# Patient Record
Sex: Female | Born: 1985 | Race: White | Hispanic: No | Marital: Married | Smoking: Current every day smoker
Health system: Southern US, Community
[De-identification: ages and names within clinical notes are randomized; demographics above are authoritative.]

## PROBLEM LIST (undated history)

## (undated) DIAGNOSIS — F419 Anxiety disorder, unspecified: Secondary | ICD-10-CM

## (undated) DIAGNOSIS — F988 Other specified behavioral and emotional disorders with onset usually occurring in childhood and adolescence: Secondary | ICD-10-CM

## (undated) HISTORY — PX: CHOLECYSTECTOMY: SHX55

## (undated) HISTORY — PX: TUBAL LIGATION: SHX77

## (undated) HISTORY — PX: APPENDECTOMY: SHX54

## (undated) HISTORY — DX: Other specified behavioral and emotional disorders with onset usually occurring in childhood and adolescence: F98.8

## (undated) HISTORY — DX: Anxiety disorder, unspecified: F41.9

---

## 2004-01-09 ENCOUNTER — Inpatient Hospital Stay: Payer: Self-pay | Admitting: Obstetrics and Gynecology

## 2004-02-02 ENCOUNTER — Emergency Department: Payer: Self-pay | Admitting: Emergency Medicine

## 2004-02-02 ENCOUNTER — Other Ambulatory Visit: Payer: Self-pay

## 2004-02-12 ENCOUNTER — Ambulatory Visit: Payer: Self-pay | Admitting: General Surgery

## 2005-01-13 ENCOUNTER — Emergency Department: Payer: Self-pay | Admitting: Emergency Medicine

## 2005-09-14 ENCOUNTER — Inpatient Hospital Stay: Payer: Self-pay | Admitting: Vascular Surgery

## 2006-02-10 ENCOUNTER — Emergency Department: Payer: Self-pay

## 2006-04-27 ENCOUNTER — Emergency Department: Payer: Self-pay | Admitting: Emergency Medicine

## 2007-02-24 ENCOUNTER — Observation Stay: Payer: Self-pay | Admitting: Obstetrics and Gynecology

## 2007-03-31 ENCOUNTER — Observation Stay: Payer: Self-pay | Admitting: Obstetrics & Gynecology

## 2007-04-26 ENCOUNTER — Observation Stay: Payer: Self-pay | Admitting: Obstetrics & Gynecology

## 2007-05-20 ENCOUNTER — Observation Stay: Payer: Self-pay

## 2007-05-21 ENCOUNTER — Ambulatory Visit: Payer: Self-pay | Admitting: Unknown Physician Specialty

## 2007-05-23 ENCOUNTER — Observation Stay: Payer: Self-pay

## 2007-05-30 ENCOUNTER — Observation Stay: Payer: Self-pay

## 2007-06-05 ENCOUNTER — Observation Stay: Payer: Self-pay

## 2007-06-19 ENCOUNTER — Observation Stay: Payer: Self-pay

## 2007-06-27 ENCOUNTER — Inpatient Hospital Stay: Payer: Self-pay

## 2007-10-17 ENCOUNTER — Emergency Department: Payer: Self-pay | Admitting: Emergency Medicine

## 2008-08-04 ENCOUNTER — Emergency Department: Payer: Self-pay | Admitting: Emergency Medicine

## 2009-05-23 ENCOUNTER — Emergency Department: Payer: Self-pay | Admitting: Unknown Physician Specialty

## 2009-09-05 ENCOUNTER — Observation Stay: Payer: Self-pay

## 2009-11-16 ENCOUNTER — Observation Stay: Payer: Self-pay

## 2010-01-10 ENCOUNTER — Observation Stay: Payer: Self-pay | Admitting: Obstetrics and Gynecology

## 2010-01-11 ENCOUNTER — Ambulatory Visit: Payer: Self-pay | Admitting: Obstetrics and Gynecology

## 2010-02-07 ENCOUNTER — Emergency Department: Payer: Self-pay | Admitting: Emergency Medicine

## 2010-05-15 ENCOUNTER — Emergency Department: Payer: Self-pay | Admitting: Emergency Medicine

## 2011-11-19 ENCOUNTER — Emergency Department: Payer: Self-pay | Admitting: Emergency Medicine

## 2011-11-20 ENCOUNTER — Emergency Department: Payer: Self-pay | Admitting: Emergency Medicine

## 2011-11-25 ENCOUNTER — Emergency Department: Payer: Self-pay | Admitting: Internal Medicine

## 2012-07-21 ENCOUNTER — Emergency Department: Payer: Self-pay | Admitting: Emergency Medicine

## 2012-07-21 LAB — CBC
HCT: 34.9 % — ABNORMAL LOW (ref 35.0–47.0)
HGB: 11.6 g/dL — ABNORMAL LOW (ref 12.0–16.0)
MCH: 25.7 pg — ABNORMAL LOW (ref 26.0–34.0)
MCHC: 33.3 g/dL (ref 32.0–36.0)
MCV: 77 fL — ABNORMAL LOW (ref 80–100)
RDW: 16 % — ABNORMAL HIGH (ref 11.5–14.5)
WBC: 7.6 10*3/uL (ref 3.6–11.0)

## 2012-07-21 LAB — COMPREHENSIVE METABOLIC PANEL
Albumin: 3.6 g/dL (ref 3.4–5.0)
Anion Gap: 6 — ABNORMAL LOW (ref 7–16)
BUN: 10 mg/dL (ref 7–18)
Bilirubin,Total: 0.2 mg/dL (ref 0.2–1.0)
Calcium, Total: 8.5 mg/dL (ref 8.5–10.1)
EGFR (African American): >60
Glucose: 103 mg/dL — ABNORMAL HIGH (ref 65–99)
Osmolality: 282 (ref 275–301)
Potassium: 4 mmol/L (ref 3.5–5.1)
SGOT(AST): 13 U/L — ABNORMAL LOW (ref 15–37)
SGPT (ALT): 16 U/L (ref 12–78)
Sodium: 142 mmol/L (ref 136–145)
Total Protein: 6.9 g/dL (ref 6.4–8.2)

## 2012-07-21 LAB — URINALYSIS, COMPLETE
Ketone: NEGATIVE
Ph: 6 (ref 4.5–8.0)
Protein: 30
RBC,UR: NONE SEEN /HPF (ref 0–5)
Specific Gravity: 1.03 (ref 1.003–1.030)
Squamous Epithelial: 2
WBC UR: 3 /HPF (ref 0–5)

## 2012-07-25 ENCOUNTER — Emergency Department: Payer: Self-pay | Admitting: Internal Medicine

## 2012-07-25 LAB — URINALYSIS, COMPLETE
Blood: NEGATIVE
Nitrite: NEGATIVE
Ph: 7 (ref 4.5–8.0)
Protein: NEGATIVE
Specific Gravity: 1.011 (ref 1.003–1.030)
Squamous Epithelial: 2
WBC UR: <1 /HPF (ref 0–5)

## 2012-07-30 ENCOUNTER — Emergency Department: Payer: Self-pay | Admitting: Emergency Medicine

## 2012-09-24 ENCOUNTER — Emergency Department: Payer: Self-pay | Admitting: Emergency Medicine

## 2012-09-24 LAB — URINALYSIS, COMPLETE
Bilirubin,UR: NEGATIVE
Blood: NEGATIVE
Ketone: NEGATIVE
Leukocyte Esterase: NEGATIVE
Nitrite: NEGATIVE
Ph: 8 (ref 4.5–8.0)
RBC,UR: <1 /HPF (ref 0–5)
WBC UR: 1 /HPF (ref 0–5)

## 2012-09-24 LAB — COMPREHENSIVE METABOLIC PANEL
Albumin: 3.5 g/dL (ref 3.4–5.0)
Alkaline Phosphatase: 96 U/L (ref 50–136)
Anion Gap: 6 — ABNORMAL LOW (ref 7–16)
Bilirubin,Total: 0.2 mg/dL (ref 0.2–1.0)
Co2: 27 mmol/L (ref 21–32)
Creatinine: 0.73 mg/dL (ref 0.60–1.30)
EGFR (African American): >60
Glucose: 95 mg/dL (ref 65–99)
Osmolality: 276 (ref 275–301)
Potassium: 3.9 mmol/L (ref 3.5–5.1)
Sodium: 140 mmol/L (ref 136–145)
Total Protein: 6.8 g/dL (ref 6.4–8.2)

## 2012-09-24 LAB — CBC
HCT: 35.9 % (ref 35.0–47.0)
HGB: 12 g/dL (ref 12.0–16.0)
MCH: 26.1 pg (ref 26.0–34.0)
MCHC: 33.6 g/dL (ref 32.0–36.0)
Platelet: 314 10*3/uL (ref 150–440)
WBC: 7.1 10*3/uL (ref 3.6–11.0)

## 2012-09-24 LAB — WET PREP, GENITAL

## 2012-09-24 LAB — GC/CHLAMYDIA PROBE AMP

## 2012-09-24 LAB — HCG, QUANTITATIVE, PREGNANCY: Beta Hcg, Quant.: <1 m[IU]/mL — ABNORMAL LOW

## 2012-10-12 ENCOUNTER — Emergency Department: Payer: Self-pay | Admitting: Emergency Medicine

## 2012-10-15 ENCOUNTER — Emergency Department: Payer: Self-pay | Admitting: Emergency Medicine

## 2012-11-14 ENCOUNTER — Emergency Department: Payer: Self-pay | Admitting: Emergency Medicine

## 2012-11-14 LAB — COMPREHENSIVE METABOLIC PANEL
Albumin: 3.5 g/dL (ref 3.4–5.0)
Alkaline Phosphatase: 90 U/L (ref 50–136)
Anion Gap: 7 (ref 7–16)
BUN: 6 mg/dL — ABNORMAL LOW (ref 7–18)
Bilirubin,Total: 0.3 mg/dL (ref 0.2–1.0)
Calcium, Total: 8.8 mg/dL (ref 8.5–10.1)
Chloride: 107 mmol/L (ref 98–107)
Co2: 24 mmol/L (ref 21–32)
Creatinine: 0.69 mg/dL (ref 0.60–1.30)
EGFR (African American): >60
EGFR (Non-African Amer.): >60
Glucose: 102 mg/dL — ABNORMAL HIGH (ref 65–99)
Osmolality: 273 (ref 275–301)
Potassium: 3.5 mmol/L (ref 3.5–5.1)
SGOT(AST): 17 U/L (ref 15–37)
Total Protein: 6.8 g/dL (ref 6.4–8.2)

## 2012-11-14 LAB — GC/CHLAMYDIA PROBE AMP

## 2012-11-14 LAB — URINALYSIS, COMPLETE
Bilirubin,UR: NEGATIVE
Ketone: NEGATIVE
Nitrite: NEGATIVE
Ph: 5 (ref 4.5–8.0)
Specific Gravity: 1.009 (ref 1.003–1.030)
Squamous Epithelial: 1

## 2012-11-14 LAB — CBC
HCT: 33.8 % — ABNORMAL LOW (ref 35.0–47.0)
MCHC: 33.9 g/dL (ref 32.0–36.0)
MCV: 79 fL — ABNORMAL LOW (ref 80–100)
Platelet: 337 10*3/uL (ref 150–440)
RBC: 4.31 10*6/uL (ref 3.80–5.20)
RDW: 16.4 % — ABNORMAL HIGH (ref 11.5–14.5)
WBC: 13.6 10*3/uL — ABNORMAL HIGH (ref 3.6–11.0)

## 2012-11-14 LAB — PREGNANCY, URINE: Pregnancy Test, Urine: NEGATIVE m[IU]/mL

## 2012-12-17 ENCOUNTER — Emergency Department: Payer: Self-pay | Admitting: Emergency Medicine

## 2012-12-17 LAB — COMPREHENSIVE METABOLIC PANEL
Anion Gap: 1 — ABNORMAL LOW (ref 7–16)
BUN: 4 mg/dL — ABNORMAL LOW (ref 7–18)
Creatinine: 0.66 mg/dL (ref 0.60–1.30)
EGFR (African American): >60
EGFR (Non-African Amer.): >60
Osmolality: 275 (ref 275–301)
Potassium: 3.5 mmol/L (ref 3.5–5.1)
SGOT(AST): 20 U/L (ref 15–37)
SGPT (ALT): 34 U/L (ref 12–78)
Sodium: 139 mmol/L (ref 136–145)
Total Protein: 7.5 g/dL (ref 6.4–8.2)

## 2012-12-17 LAB — URINALYSIS, COMPLETE
Ketone: NEGATIVE
Leukocyte Esterase: NEGATIVE
Ph: 5 (ref 4.5–8.0)
Protein: NEGATIVE
Specific Gravity: 1.011 (ref 1.003–1.030)
Squamous Epithelial: 2

## 2012-12-17 LAB — LIPASE, BLOOD: Lipase: 95 U/L (ref 73–393)

## 2012-12-17 LAB — PREGNANCY, URINE: Pregnancy Test, Urine: NEGATIVE m[IU]/mL

## 2012-12-17 LAB — CBC
MCV: 79 fL — ABNORMAL LOW (ref 80–100)
RBC: 5.07 10*6/uL (ref 3.80–5.20)
RDW: 16.6 % — ABNORMAL HIGH (ref 11.5–14.5)
WBC: 7.9 10*3/uL (ref 3.6–11.0)

## 2013-01-15 ENCOUNTER — Emergency Department: Payer: Self-pay | Admitting: Emergency Medicine

## 2013-01-15 LAB — COMPREHENSIVE METABOLIC PANEL
Anion Gap: 6 — ABNORMAL LOW (ref 7–16)
Bilirubin,Total: 0.1 mg/dL — ABNORMAL LOW (ref 0.2–1.0)
Chloride: 106 mmol/L (ref 98–107)
Co2: 26 mmol/L (ref 21–32)
EGFR (African American): >60
Glucose: 77 mg/dL (ref 65–99)
Potassium: 3.2 mmol/L — ABNORMAL LOW (ref 3.5–5.1)
SGOT(AST): 20 U/L (ref 15–37)
SGPT (ALT): 18 U/L (ref 12–78)
Sodium: 138 mmol/L (ref 136–145)
Total Protein: 7.1 g/dL (ref 6.4–8.2)

## 2013-01-15 LAB — URINALYSIS, COMPLETE
Bilirubin,UR: NEGATIVE
Blood: NEGATIVE
Glucose,UR: NEGATIVE mg/dL (ref 0–75)
Leukocyte Esterase: NEGATIVE
Specific Gravity: 1.008 (ref 1.003–1.030)
Squamous Epithelial: 12

## 2013-01-15 LAB — CBC WITH DIFFERENTIAL/PLATELET
Basophil #: 0.1 10*3/uL (ref 0.0–0.1)
Basophil %: 0.5 %
Eosinophil %: 0.9 %
HGB: 11.9 g/dL — ABNORMAL LOW (ref 12.0–16.0)
MCHC: 32.6 g/dL (ref 32.0–36.0)
Monocyte #: 0.7 x10 3/mm (ref 0.2–0.9)
Neutrophil #: 8.3 10*3/uL — ABNORMAL HIGH (ref 1.4–6.5)
RBC: 4.62 10*6/uL (ref 3.80–5.20)

## 2013-01-16 LAB — LIPASE, BLOOD: Lipase: 288 U/L (ref 73–393)

## 2013-01-30 ENCOUNTER — Emergency Department: Payer: Self-pay | Admitting: Emergency Medicine

## 2013-01-30 LAB — COMPREHENSIVE METABOLIC PANEL
Anion Gap: 4 — ABNORMAL LOW (ref 7–16)
BUN: 7 mg/dL (ref 7–18)
Bilirubin,Total: 0.3 mg/dL (ref 0.2–1.0)
Chloride: 108 mmol/L — ABNORMAL HIGH (ref 98–107)
Co2: 27 mmol/L (ref 21–32)
EGFR (African American): >60
EGFR (Non-African Amer.): >60
Glucose: 96 mg/dL (ref 65–99)
Potassium: 4 mmol/L (ref 3.5–5.1)
Sodium: 139 mmol/L (ref 136–145)
Total Protein: 6.9 g/dL (ref 6.4–8.2)

## 2013-01-30 LAB — HCG, QUANTITATIVE, PREGNANCY: Beta Hcg, Quant.: <1 m[IU]/mL — ABNORMAL LOW

## 2013-01-30 LAB — WET PREP, GENITAL

## 2013-01-30 LAB — CBC
HCT: 36.7 % (ref 35.0–47.0)
HGB: 12 g/dL (ref 12.0–16.0)
MCH: 25.8 pg — ABNORMAL LOW (ref 26.0–34.0)
MCHC: 32.7 g/dL (ref 32.0–36.0)
Platelet: 332 10*3/uL (ref 150–440)
WBC: 7.8 10*3/uL (ref 3.6–11.0)

## 2013-01-30 LAB — URINALYSIS, COMPLETE
Bacteria: NONE SEEN
Nitrite: NEGATIVE
Ph: 6 (ref 4.5–8.0)
RBC,UR: 237 /HPF (ref 0–5)
Squamous Epithelial: 1

## 2013-01-30 LAB — GC/CHLAMYDIA PROBE AMP

## 2013-03-08 ENCOUNTER — Emergency Department: Payer: Self-pay | Admitting: Emergency Medicine

## 2013-03-26 ENCOUNTER — Emergency Department: Payer: Self-pay | Admitting: Emergency Medicine

## 2013-04-20 ENCOUNTER — Emergency Department: Payer: Self-pay | Admitting: Emergency Medicine

## 2013-04-20 LAB — URINALYSIS, COMPLETE
Bilirubin,UR: NEGATIVE
Glucose,UR: NEGATIVE mg/dL (ref 0–75)
Ketone: NEGATIVE
Leukocyte Esterase: NEGATIVE
Nitrite: NEGATIVE
PH: 8 (ref 4.5–8.0)
Protein: NEGATIVE
Specific Gravity: 1.011 (ref 1.003–1.030)
WBC UR: 9 /HPF (ref 0–5)

## 2013-04-20 LAB — PREGNANCY, URINE: Pregnancy Test, Urine: NEGATIVE m[IU]/mL

## 2013-04-22 ENCOUNTER — Emergency Department: Payer: Self-pay | Admitting: Emergency Medicine

## 2013-04-22 LAB — URINALYSIS, COMPLETE
Bilirubin,UR: NEGATIVE
Glucose,UR: NEGATIVE mg/dL (ref 0–75)
KETONE: NEGATIVE
Leukocyte Esterase: NEGATIVE
NITRITE: NEGATIVE
Ph: 6 (ref 4.5–8.0)
Protein: 30
RBC,UR: 786 /HPF (ref 0–5)
Specific Gravity: 1.006 (ref 1.003–1.030)

## 2013-04-22 LAB — URINE CULTURE

## 2013-06-07 ENCOUNTER — Emergency Department: Payer: Self-pay | Admitting: Emergency Medicine

## 2013-06-30 ENCOUNTER — Emergency Department: Payer: Self-pay | Admitting: Emergency Medicine

## 2013-06-30 LAB — COMPREHENSIVE METABOLIC PANEL
ALK PHOS: 92 U/L
ALT: 25 U/L (ref 12–78)
ANION GAP: 4 — AB (ref 7–16)
AST: 24 U/L (ref 15–37)
Albumin: 4 g/dL (ref 3.4–5.0)
BUN: 7 mg/dL (ref 7–18)
Bilirubin,Total: 0.3 mg/dL (ref 0.2–1.0)
CO2: 30 mmol/L (ref 21–32)
Calcium, Total: 9.5 mg/dL (ref 8.5–10.1)
Chloride: 104 mmol/L (ref 98–107)
Creatinine: 0.7 mg/dL (ref 0.60–1.30)
EGFR (Non-African Amer.): >60
GLUCOSE: 100 mg/dL — AB (ref 65–99)
Osmolality: 274 (ref 275–301)
POTASSIUM: 4.1 mmol/L (ref 3.5–5.1)
Sodium: 138 mmol/L (ref 136–145)
TOTAL PROTEIN: 7.9 g/dL (ref 6.4–8.2)

## 2013-06-30 LAB — CBC
HCT: 40.5 % (ref 35.0–47.0)
HGB: 13.2 g/dL (ref 12.0–16.0)
MCH: 26.6 pg (ref 26.0–34.0)
MCHC: 32.6 g/dL (ref 32.0–36.0)
MCV: 82 fL (ref 80–100)
PLATELETS: 393 10*3/uL (ref 150–440)
RBC: 4.96 10*6/uL (ref 3.80–5.20)
RDW: 15.9 % — ABNORMAL HIGH (ref 11.5–14.5)
WBC: 7.9 10*3/uL (ref 3.6–11.0)

## 2013-06-30 LAB — LIPASE, BLOOD: Lipase: 102 U/L (ref 73–393)

## 2013-07-19 ENCOUNTER — Emergency Department: Payer: Self-pay | Admitting: Emergency Medicine

## 2013-07-19 LAB — COMPREHENSIVE METABOLIC PANEL
ALBUMIN: 4 g/dL (ref 3.4–5.0)
ANION GAP: 6 — AB (ref 7–16)
Alkaline Phosphatase: 72 U/L
BUN: 9 mg/dL (ref 7–18)
Bilirubin,Total: 0.4 mg/dL (ref 0.2–1.0)
CHLORIDE: 108 mmol/L — AB (ref 98–107)
CO2: 24 mmol/L (ref 21–32)
Calcium, Total: 8.7 mg/dL (ref 8.5–10.1)
Creatinine: 0.71 mg/dL (ref 0.60–1.30)
EGFR (African American): >60
GLUCOSE: 92 mg/dL (ref 65–99)
OSMOLALITY: 274 (ref 275–301)
Potassium: 4 mmol/L (ref 3.5–5.1)
SGOT(AST): 12 U/L — ABNORMAL LOW (ref 15–37)
SGPT (ALT): 15 U/L (ref 12–78)
Sodium: 138 mmol/L (ref 136–145)
TOTAL PROTEIN: 7.1 g/dL (ref 6.4–8.2)

## 2013-07-19 LAB — URINALYSIS, COMPLETE
Bacteria: NONE SEEN
Bilirubin,UR: NEGATIVE
Glucose,UR: NEGATIVE mg/dL (ref 0–75)
Ketone: NEGATIVE
Nitrite: NEGATIVE
PH: 6 (ref 4.5–8.0)
Protein: NEGATIVE
RBC,UR: 111 /HPF (ref 0–5)
Specific Gravity: 1.021 (ref 1.003–1.030)

## 2013-07-19 LAB — CBC WITH DIFFERENTIAL/PLATELET
BASOS PCT: 0.5 %
Basophil #: 0.1 10*3/uL (ref 0.0–0.1)
EOS PCT: 1 %
Eosinophil #: 0.1 10*3/uL (ref 0.0–0.7)
HCT: 39.5 % (ref 35.0–47.0)
HGB: 12.6 g/dL (ref 12.0–16.0)
Lymphocyte #: 1.4 10*3/uL (ref 1.0–3.6)
Lymphocyte %: 10.8 %
MCH: 25.9 pg — ABNORMAL LOW (ref 26.0–34.0)
MCHC: 31.9 g/dL — ABNORMAL LOW (ref 32.0–36.0)
MCV: 81 fL (ref 80–100)
Monocyte #: 0.6 x10 3/mm (ref 0.2–0.9)
Monocyte %: 5 %
NEUTROS ABS: 10.5 10*3/uL — AB (ref 1.4–6.5)
NEUTROS PCT: 82.7 %
Platelet: 387 10*3/uL (ref 150–440)
RBC: 4.88 10*6/uL (ref 3.80–5.20)
RDW: 15.3 % — ABNORMAL HIGH (ref 11.5–14.5)
WBC: 12.7 10*3/uL — AB (ref 3.6–11.0)

## 2013-07-19 LAB — LIPASE, BLOOD: Lipase: 131 U/L (ref 73–393)

## 2013-09-07 ENCOUNTER — Emergency Department: Payer: Self-pay | Admitting: Emergency Medicine

## 2013-09-07 ENCOUNTER — Inpatient Hospital Stay (HOSPITAL_COMMUNITY)
Admission: AD | Admit: 2013-09-07 | Discharge: 2013-09-08 | Disposition: A | Discharge status: Home / Self Care | Payer: Medicaid Other | Source: Ambulatory Visit | Attending: Obstetrics & Gynecology | Admitting: Obstetrics & Gynecology

## 2013-09-07 DIAGNOSIS — N946 Dysmenorrhea, unspecified: Secondary | ICD-10-CM | POA: Diagnosis not present

## 2013-09-07 DIAGNOSIS — F17200 Nicotine dependence, unspecified, uncomplicated: Secondary | ICD-10-CM | POA: Insufficient documentation

## 2013-09-07 DIAGNOSIS — N390 Urinary tract infection, site not specified: Secondary | ICD-10-CM

## 2013-09-07 DIAGNOSIS — R1032 Left lower quadrant pain: Secondary | ICD-10-CM | POA: Insufficient documentation

## 2013-09-07 LAB — URINE MICROSCOPIC-ADD ON

## 2013-09-07 LAB — URINALYSIS, ROUTINE W REFLEX MICROSCOPIC
BILIRUBIN URINE: NEGATIVE
GLUCOSE, UA: 100 mg/dL — AB
Ketones, ur: 15 mg/dL — AB
Leukocytes, UA: NEGATIVE
Nitrite: POSITIVE — AB
PH: 5 (ref 5.0–8.0)
Protein, ur: 100 mg/dL — AB
Urobilinogen, UA: 1 mg/dL (ref 0.0–1.0)

## 2013-09-07 LAB — POCT PREGNANCY, URINE: Preg Test, Ur: NEGATIVE

## 2013-09-07 MED ORDER — HYDROMORPHONE HCL PF 1 MG/ML IJ SOLN
1.0000 mg | INTRAMUSCULAR | Status: AC
  Administered 2013-09-07: 1 mg via INTRAVENOUS
  Filled 2013-09-07: qty 1

## 2013-09-07 NOTE — MAU Note (Signed)
Pt started with left lower abd pain at 1700 tonight, took 400mg  ibuprofen at 1730 without relief.  Pt is on her cycle and denies any problems with urination.  Was seen at [facility] one month ago and told she had a ruptured cyst.

## 2013-09-07 NOTE — MAU Provider Note (Signed)
Chief Complaint: No chief complaint on file.   First Provider Initiated Contact with Patient 09/07/13 2339     SUBJECTIVE HPI: Pamela Meyers is a 28 y.o. who presents to maternity admissions reporting severe LLQ pain with onset today. She is crying and writhing in pain in MAU.  She reports she was seen at [facility] 1 month ago and dx with ruptured ovarian cyst, with other cysts in ovary at that time.  She reports taking ibuprofen 600 mg today without relief, then went to [facility] and waited 2 hours in the waiting room, then decided to come to MAU.  She reports she is currently menstruating with moderate bleeding which is normal for her.  She has  Hx of heavy regular menses since menarche but reports they have not been painful until the last few months.  She denies vaginal itching/burning, urinary symptoms, h/a, dizziness, n/v, or fever/chills.     History reviewed. No pertinent past medical history. Past Surgical History  Procedure Laterality Date  . Appendectomy    . Cholecystectomy    . Tubal ligation     History   Social History  . Marital Status: Married    Spouse Name: N/A    Number of Children: N/A  . Years of Education: N/A   Occupational History  . Not on file.   Social History Main Topics  . Smoking status: Current Every Day Smoker -- 0.75 packs/day for 17 years    Types: Cigarettes  . Smokeless tobacco: Not on file  . Alcohol Use: No  . Drug Use: No  . Sexual Activity: Yes    Birth Control/ Protection: Surgical     Comment: last sex two days ago   Other Topics Concern  . Not on file   Social History Narrative  . No narrative on file   No current facility-administered medications on file prior to encounter.   No current outpatient prescriptions on file prior to encounter.   Allergies  Allergen Reactions  . Penicillins Hives  . Tramadol Nausea And Vomiting    ROS: Pertinent items in HPI  OBJECTIVE Blood pressure 119/69, pulse 77,  temperature 98.3 F (36.8 C), temperature source Oral, resp. rate 16, last menstrual period 09/07/2013, SpO2 100.00%. GENERAL: Well-developed, well-nourished female in moderate distress.  HEENT: Normocephalic HEART: normal rate RESP: normal effort ABDOMEN: Soft, no tenderness on right side, no rebound tenderness or guarding Musculoskeletal:  Negative CVA tenderness EXTREMITIES: Nontender, no edema NEURO: Alert and oriented Pelvic exam: Cervix pink, visually closed, without lesion, large amount dark red bleeding noted, vaginal walls and external genitalia normal Bimanual exam: Cervix 0/long/high, firm, anterior, neg CMT, uterus nontender, nonenlarged, with significant tenderness on left, slightly enlarged on left with possible mass near ovary  LAB RESULTS Results for orders placed during the hospital encounter of 09/07/13 (from the past 24 hour(s))  URINALYSIS, ROUTINE W REFLEX MICROSCOPIC     Status: Abnormal   Collection Time    09/07/13 11:08 PM      Result Value Ref Range   Color, Urine ORANGE (*) YELLOW   APPearance CLEAR  CLEAR   Specific Gravity, Urine >1.030 (*) 1.005 - 1.030   pH 5.0  5.0 - 8.0   Glucose, UA 100 (*) NEGATIVE mg/dL   Hgb urine dipstick LARGE (*) NEGATIVE   Bilirubin Urine NEGATIVE  NEGATIVE   Ketones, ur 15 (*) NEGATIVE mg/dL   Protein, ur 409100 (*) NEGATIVE mg/dL   Urobilinogen, UA 1.0  0.0 - 1.0  mg/dL   Nitrite POSITIVE (*) NEGATIVE   Leukocytes, UA NEGATIVE  NEGATIVE  URINE MICROSCOPIC-ADD ON     Status: Abnormal   Collection Time    09/07/13 11:08 PM      Result Value Ref Range   Squamous Epithelial / LPF RARE  RARE   WBC, UA 3-6  <3 WBC/hpf   RBC / HPF TOO NUMEROUS TO COUNT  <3 RBC/hpf   Bacteria, UA MANY (*) RARE  POCT PREGNANCY, URINE     Status: None   Collection Time    09/07/13 11:12 PM      Result Value Ref Range   Preg Test, Ur NEGATIVE  NEGATIVE  WET PREP, GENITAL     Status: Abnormal   Collection Time    09/08/13 12:30 AM      Result  Value Ref Range   Yeast Wet Prep HPF POC NONE SEEN  NONE SEEN   Trich, Wet Prep NONE SEEN  NONE SEEN   Clue Cells Wet Prep HPF POC NONE SEEN  NONE SEEN   WBC, Wet Prep HPF POC FEW (*) NONE SEEN    IMAGING US Transvaginal Non-ob  09/08/2013   CLINICAL DATA:  Left lower quadrant pain for 1 day. History of ruptured ovarian cysts.  EXAM: TRANSABDOMINAL AND TRANSVAGINAL ULTRASOUND OF PELVIS  TECHNIQUE: Both transabdominal and transvaginal ultrasound examinations of the pelvis were performed. Transabdominal technique was performed for global imaging of the pelvis including uterus, ovaries, adnexal regions, and pelvic cul-de-sac. It was necessary to proceed with endovaginal exam following the transabdominal exam to visualize the uterus and ovaries.  COMPARISON:  07/19/2013  FINDINGS: Uterus  Measurements: 8 x 5 x 5 cm, retroverted. No fibroids or other mass visualized. Small nabothian cysts in the cervix.  Endometrium  Thickness: 10 mm.  No focal abnormality visualized.  Right ovary  Measurements: 4.3 x 1.7 x 1.6 cm. Normal appearance/no adnexal mass.  Left ovary  Measurements: 3.5 x 2.4 x 1.7 cm. Normal appearance/no adnexal mass.  Other findings  Minimal free fluid demonstrated in the pelvis, likely physiologic.  IMPRESSION: Normal ultrasound appearance of the uterus and ovaries. Minimal free fluid.   Electronically Signed   By: Burman Nieves M.D.   On: 09/08/2013 01:40   US Pelvis Complete  09/08/2013   CLINICAL DATA:  Left lower quadrant pain for 1 day. History of ruptured ovarian cysts.  EXAM: TRANSABDOMINAL AND TRANSVAGINAL ULTRASOUND OF PELVIS  TECHNIQUE: Both transabdominal and transvaginal ultrasound examinations of the pelvis were performed. Transabdominal technique was performed for global imaging of the pelvis including uterus, ovaries, adnexal regions, and pelvic cul-de-sac. It was necessary to proceed with endovaginal exam following the transabdominal exam to visualize the uterus and ovaries.   COMPARISON:  07/19/2013  FINDINGS: Uterus  Measurements: 8 x 5 x 5 cm, retroverted. No fibroids or other mass visualized. Small nabothian cysts in the cervix.  Endometrium  Thickness: 10 mm.  No focal abnormality visualized.  Right ovary  Measurements: 4.3 x 1.7 x 1.6 cm. Normal appearance/no adnexal mass.  Left ovary  Measurements: 3.5 x 2.4 x 1.7 cm. Normal appearance/no adnexal mass.  Other findings  Minimal free fluid demonstrated in the pelvis, likely physiologic.  IMPRESSION: Normal ultrasound appearance of the uterus and ovaries. Minimal free fluid.   Electronically Signed   By: Burman Nieves M.D.   On: 09/08/2013 01:40   MAU Management Records obtained from [facility].  U/S on 7/6 with no masses on ovaries, normal uterus, but  small free fluid, possibly ruptured ovarian cyst.   Dilaudid 1 mg IM, Toradol 60 mg IM  ASSESSMENT 1. UTI (lower urinary tract infection)   2. Dysmenorrhea     PLAN Discharge home Cipro 500 mg BID x 7 days, first dose given in MAU Ibuprofen 600 mg Q 6 hours PRN Percocet 5/325, take 1-2 tabs Q 6 hours PRN x 10 tabs Discussed management of dysmenorrhea with pt. Pt has taken OCPs before with improvement in heaviness of menses.  She denies personal or family hx of blood clots.  Sprintec 28 Rx to pharmacy.   Urine sent for culture F/U in [facility] for management of dysmenorrhea    Medication List         ciprofloxacin 500 MG tablet  Commonly known as:  CIPRO  Take 1 tablet (500 mg total) by mouth 2 (two) times daily.     ibuprofen 600 MG tablet  Commonly known as:  ADVIL,MOTRIN  Take 1 tablet (600 mg total) by mouth every 6 (six) hours as needed.     norgestimate-ethinyl estradiol 0.25-35 MG-MCG tablet  Commonly known as:  ORTHO-CYCLEN,SPRINTEC,PREVIFEM  Take 1 tablet by mouth daily.     oxyCODONE-acetaminophen 5-325 MG per tablet  Commonly known as:  PERCOCET/ROXICET  Take 1-2 tablets by mouth every 6 (six) hours as needed.       Follow-up  Information   Schedule an appointment as soon as possible for a visit with [facility]   Specialty:  Obstetrics and Gynecology   Contact information:   [address] [place] [address] [phone]      Follow up with [facility] [place] [facility] ADMISS[facility]. (As needed for emergencies)    Contact information:   [address] [id number] [place] [address] [phone]      Sharen Counter Certified Nurse-Midwife 09/08/2013  2:25 AM

## 2013-09-08 ENCOUNTER — Inpatient Hospital Stay (HOSPITAL_COMMUNITY): Payer: Medicaid Other

## 2013-09-08 ENCOUNTER — Encounter (HOSPITAL_COMMUNITY): Payer: Self-pay | Admitting: *Deleted

## 2013-09-08 DIAGNOSIS — N390 Urinary tract infection, site not specified: Secondary | ICD-10-CM

## 2013-09-08 LAB — WET PREP, GENITAL
CLUE CELLS WET PREP: NONE SEEN
Trich, Wet Prep: NONE SEEN
Yeast Wet Prep HPF POC: NONE SEEN

## 2013-09-08 MED ORDER — KETOROLAC TROMETHAMINE 60 MG/2ML IM SOLN
60.0000 mg | INTRAMUSCULAR | Status: AC
  Administered 2013-09-08: 60 mg via INTRAMUSCULAR
  Filled 2013-09-08: qty 2

## 2013-09-08 MED ORDER — CIPROFLOXACIN HCL 500 MG PO TABS: 500.0000 mg | ORAL_TABLET | Freq: Two times a day (BID) | ORAL | Status: DC

## 2013-09-08 MED ORDER — IBUPROFEN 600 MG PO TABS: 600.0000 mg | ORAL_TABLET | Freq: Four times a day (QID) | ORAL | Status: DC | PRN

## 2013-09-08 MED ORDER — CIPROFLOXACIN HCL 500 MG PO TABS
500.0000 mg | ORAL_TABLET | ORAL | Status: AC
  Administered 2013-09-08: 500 mg via ORAL
  Filled 2013-09-08: qty 1

## 2013-09-08 MED ORDER — NORGESTIMATE-ETH ESTRADIOL 0.25-35 MG-MCG PO TABS: 1.0000 | ORAL_TABLET | Freq: Every day | ORAL | Status: DC

## 2013-09-08 MED ORDER — OXYCODONE-ACETAMINOPHEN 5-325 MG PO TABS: 1.0000 | ORAL_TABLET | Freq: Four times a day (QID) | ORAL | Status: DC | PRN

## 2013-09-08 NOTE — Discharge Instructions (Signed)
Urinary Tract Infection °Urinary tract infections (UTIs) can develop anywhere along your urinary tract. Your urinary tract is your body's drainage system for removing wastes and extra water. Your urinary tract includes two kidneys, two ureters, a bladder, and a urethra. Your kidneys are a pair of bean-shaped organs. Each kidney is about the size of your fist. They are located below your ribs, one on each side of your spine. °CAUSES °Infections are caused by microbes, which are microscopic organisms, including fungi, viruses, and bacteria. These organisms are so small that they can only be seen through a microscope. Bacteria are the microbes that most commonly cause UTIs. °SYMPTOMS  °Symptoms of UTIs may vary by age and gender of the patient and by the location of the infection. Symptoms in young women typically include a frequent and intense urge to urinate and a painful, burning feeling in the bladder or urethra during urination. Older women and men are more likely to be tired, shaky, and weak and have muscle aches and abdominal pain. A fever may mean the infection is in your kidneys. Other symptoms of a kidney infection include pain in your back or sides below the ribs, nausea, and vomiting. °DIAGNOSIS °To diagnose a UTI, your caregiver will ask you about your symptoms. Your caregiver also will ask to provide a urine sample. The urine sample will be tested for bacteria and white blood cells. White blood cells are made by your body to help fight infection. °TREATMENT  °Typically, UTIs can be treated with medication. Because most UTIs are caused by a bacterial infection, they usually can be treated with the use of antibiotics. The choice of antibiotic and length of treatment depend on your symptoms and the type of bacteria causing your infection. °HOME CARE INSTRUCTIONS °· If you were prescribed antibiotics, take them exactly as your caregiver instructs you. Finish the medication even if you feel better after you  have only taken some of the medication. °· Drink enough water and fluids to keep your urine clear or pale yellow. °· Avoid caffeine, tea, and carbonated beverages. They tend to irritate your bladder. °· Empty your bladder often. Avoid holding urine for long periods of time. °· Empty your bladder before and after sexual intercourse. °· After a bowel movement, women should cleanse from front to back. Use each tissue only once. °SEEK MEDICAL CARE IF:  °· You have back pain. °· You develop a fever. °· Your symptoms do not begin to resolve within 3 days. °SEEK IMMEDIATE MEDICAL CARE IF:  °· You have severe back pain or lower abdominal pain. °· You develop chills. °· You have nausea or vomiting. °· You have continued burning or discomfort with urination. °MAKE SURE YOU:  °· Understand these instructions. °· Will watch your condition. °· Will get help right away if you are not doing well or get worse. °Document Released: 11/09/2004 Document Revised: 08/01/2011 Document Reviewed: 03/10/2011 °ExitCare® Patient Information ©2015 [employer]. This information is not intended to replace advice given to you by your health care provider. Make sure you discuss any questions you have with your health care provider. °Dysmenorrhea °Menstrual cramps (dysmenorrhea) are caused by the muscles of the uterus tightening (contracting) during a menstrual period. For some women, this discomfort is merely bothersome. For others, dysmenorrhea can be severe enough to interfere with everyday activities for a few days each month. °Primary dysmenorrhea is menstrual cramps that last a couple of days when you start having menstrual periods or soon after. This often begins   after a teenager starts having her period. As a woman gets older or has a baby, the cramps will usually lessen or disappear. Secondary dysmenorrhea begins later in life, lasts longer, and the pain may be stronger than primary dysmenorrhea. The pain may start before the period and  last a few days after the period.  °CAUSES  °Dysmenorrhea is usually caused by an underlying problem, such as: °· The tissue lining the uterus grows outside of the uterus in other areas of the body (endometriosis). °· The endometrial tissue, which normally lines the uterus, is found in or grows into the muscular walls of the uterus (adenomyosis). °· The pelvic blood vessels are engorged with blood just before the menstrual period (pelvic congestive syndrome). °· Overgrowth of cells (polyps) in the lining of the uterus or cervix. °· Falling down of the uterus (prolapse) because of loose or stretched ligaments. °· Depression. °· Bladder problems, infection, or inflammation. °· Problems with the intestine, a tumor, or irritable bowel syndrome. °· Cancer of the female organs or bladder. °· A severely tipped uterus. °· A very tight opening or closed cervix. °· Noncancerous tumors of the uterus (fibroids). °· Pelvic inflammatory disease (PID). °· Pelvic scarring (adhesions) from a previous surgery. °· Ovarian cyst. °· An intrauterine device (IUD) used for birth control. °RISK FACTORS °You may be at greater risk of dysmenorrhea if: °· You are younger than age 30. °· You started puberty early. °· You have irregular or heavy bleeding. °· You have never given birth. °· You have a family history of this problem. °· You are a smoker. °SIGNS AND SYMPTOMS  °· Cramping or throbbing pain in your lower abdomen. °· Headaches. °· Lower back pain. °· Nausea or vomiting. °· Diarrhea. °· Sweating or dizziness. °· Loose stools. °DIAGNOSIS  °A diagnosis is based on your history, symptoms, physical exam, diagnostic tests, or procedures. Diagnostic tests or procedures may include: °· Blood tests. °· Ultrasonography. °· An examination of the lining of the uterus (dilation and curettage, D&C). °· An examination inside your abdomen or pelvis with a scope (laparoscopy). °· X-rays. °· CT scan. °· MRI. °· An examination inside the bladder with a  scope (cystoscopy). °· An examination inside the intestine or stomach with a scope (colonoscopy, gastroscopy). °TREATMENT  °Treatment depends on the cause of the dysmenorrhea. Treatment may include: °· Pain medicine prescribed by your health care provider. °· Birth control pills or an IUD with progesterone hormone in it. °· Hormone replacement therapy. °· Nonsteroidal anti-inflammatory drugs (NSAIDs). These may help stop the production of prostaglandins. °· Surgery to remove adhesions, endometriosis, ovarian cyst, or fibroids. °· Removal of the uterus (hysterectomy). °· Progesterone shots to stop the menstrual period. °· Cutting the nerves on the sacrum that go to the female organs (presacral neurectomy). °· Electric current to the sacral nerves (sacral nerve stimulation). °· Antidepressant medicine. °· Psychiatric therapy, counseling, or group therapy. °· Exercise and physical therapy. °· Meditation and yoga therapy. °· Acupuncture. °HOME CARE INSTRUCTIONS  °· Only take over-the-counter or prescription medicines as directed by your health care provider. °· Place a heating pad or hot water bottle on your lower back or abdomen. Do not sleep with the heating pad. °· Use aerobic exercises, walking, swimming, biking, and other exercises to help lessen the cramping. °· Massage to the lower back or abdomen may help. °· Stop smoking. °· Avoid alcohol and caffeine. °SEEK MEDICAL CARE IF:  °· Your pain does not get better with medicine. °· You   have pain with sexual intercourse. °· Your pain increases and is not controlled with medicines. °· You have abnormal vaginal bleeding with your period. °· You develop nausea or vomiting with your period that is not controlled with medicine. °SEEK IMMEDIATE MEDICAL CARE IF:  °You pass out.  °Document Released: 01/30/2005 Document Revised: 10/02/2012 Document Reviewed: 07/18/2012 °ExitCare® Patient Information ©2015 [employer]. This information is not intended to replace advice given  to you by your health care provider. Make sure you discuss any questions you have with your health care provider. ° °

## 2013-09-09 ENCOUNTER — Emergency Department: Payer: Self-pay | Admitting: Emergency Medicine

## 2013-09-09 LAB — CBC
HCT: 36 % (ref 35.0–47.0)
HGB: 11.9 g/dL — ABNORMAL LOW (ref 12.0–16.0)
MCH: 26.9 pg (ref 26.0–34.0)
MCHC: 33.1 g/dL (ref 32.0–36.0)
MCV: 81 fL (ref 80–100)
Platelet: 344 10*3/uL (ref 150–440)
RBC: 4.44 10*6/uL (ref 3.80–5.20)
RDW: 15.5 % — ABNORMAL HIGH (ref 11.5–14.5)
WBC: 7.8 10*3/uL (ref 3.6–11.0)

## 2013-09-09 LAB — GC/CHLAMYDIA PROBE AMP
CT Probe RNA: NEGATIVE
GC Probe RNA: NEGATIVE

## 2013-11-10 ENCOUNTER — Emergency Department: Payer: Self-pay | Admitting: Emergency Medicine

## 2013-11-11 LAB — URINALYSIS, COMPLETE
BLOOD: NEGATIVE
Bacteria: NONE SEEN
Bilirubin,UR: NEGATIVE
GLUCOSE, UR: NEGATIVE mg/dL (ref 0–75)
KETONE: NEGATIVE
LEUKOCYTE ESTERASE: NEGATIVE
NITRITE: NEGATIVE
Ph: 7 (ref 4.5–8.0)
Protein: NEGATIVE
RBC,UR: 1 /HPF (ref 0–5)
Specific Gravity: 1.011 (ref 1.003–1.030)
Squamous Epithelial: 2
WBC UR: 1 /HPF (ref 0–5)

## 2013-11-11 LAB — GC/CHLAMYDIA PROBE AMP

## 2013-11-11 LAB — WET PREP, GENITAL

## 2013-12-15 ENCOUNTER — Encounter (HOSPITAL_COMMUNITY): Payer: Self-pay | Admitting: *Deleted

## 2014-01-27 ENCOUNTER — Emergency Department: Payer: Self-pay | Admitting: Emergency Medicine

## 2014-01-27 LAB — CBC WITH DIFFERENTIAL/PLATELET
Basophil #: 0 10*3/uL (ref 0.0–0.1)
Basophil %: 0.3 %
EOS ABS: 0.1 10*3/uL (ref 0.0–0.7)
Eosinophil %: 0.7 %
HCT: 40 % (ref 35.0–47.0)
HGB: 12.6 g/dL (ref 12.0–16.0)
LYMPHS ABS: 1.4 10*3/uL (ref 1.0–3.6)
Lymphocyte %: 13.5 %
MCH: 26.4 pg (ref 26.0–34.0)
MCHC: 31.6 g/dL — AB (ref 32.0–36.0)
MCV: 84 fL (ref 80–100)
MONOS PCT: 4.6 %
Monocyte #: 0.5 x10 3/mm (ref 0.2–0.9)
Neutrophil #: 8.7 10*3/uL — ABNORMAL HIGH (ref 1.4–6.5)
Neutrophil %: 80.9 %
Platelet: 387 10*3/uL (ref 150–440)
RBC: 4.78 10*6/uL (ref 3.80–5.20)
RDW: 16.5 % — ABNORMAL HIGH (ref 11.5–14.5)
WBC: 10.7 10*3/uL (ref 3.6–11.0)

## 2014-01-27 LAB — COMPREHENSIVE METABOLIC PANEL
ALBUMIN: 3.6 g/dL (ref 3.4–5.0)
Alkaline Phosphatase: 88 U/L
Anion Gap: 6 — ABNORMAL LOW (ref 7–16)
BUN: 8 mg/dL (ref 7–18)
Bilirubin,Total: 0.3 mg/dL (ref 0.2–1.0)
CALCIUM: 9 mg/dL (ref 8.5–10.1)
Chloride: 108 mmol/L — ABNORMAL HIGH (ref 98–107)
Co2: 25 mmol/L (ref 21–32)
Creatinine: 0.71 mg/dL (ref 0.60–1.30)
EGFR (Non-African Amer.): >60
Glucose: 101 mg/dL — ABNORMAL HIGH (ref 65–99)
Osmolality: 276 (ref 275–301)
Potassium: 4.5 mmol/L (ref 3.5–5.1)
SGOT(AST): 12 U/L — ABNORMAL LOW (ref 15–37)
SGPT (ALT): 20 U/L
Sodium: 139 mmol/L (ref 136–145)
TOTAL PROTEIN: 7.3 g/dL (ref 6.4–8.2)

## 2014-01-27 LAB — URINALYSIS, COMPLETE
BACTERIA: NONE SEEN
BILIRUBIN, UR: NEGATIVE
GLUCOSE, UR: NEGATIVE mg/dL (ref 0–75)
KETONE: NEGATIVE
Leukocyte Esterase: NEGATIVE
NITRITE: NEGATIVE
PH: 5 (ref 4.5–8.0)
PROTEIN: NEGATIVE
RBC,UR: 39 /HPF (ref 0–5)
SPECIFIC GRAVITY: 1.015 (ref 1.003–1.030)
Squamous Epithelial: <1

## 2014-04-11 IMAGING — CR RIGHT HAND - COMPLETE 3+ VIEW
1 series · 3 of 3 positions shown · non-contrast
Comparison: none

REASON FOR EXAM: pain s/p injury
COMMENTS:

PROCEDURE:     DXR - DXR HAND RT COMPLETE W/OBLIQUES  - July 30, 2012 [DATE]
RESULT:     Comparison: None.

[Series 1: pa · 0.17mm/px · 3 of 3 slices shown]
[im 1/3]
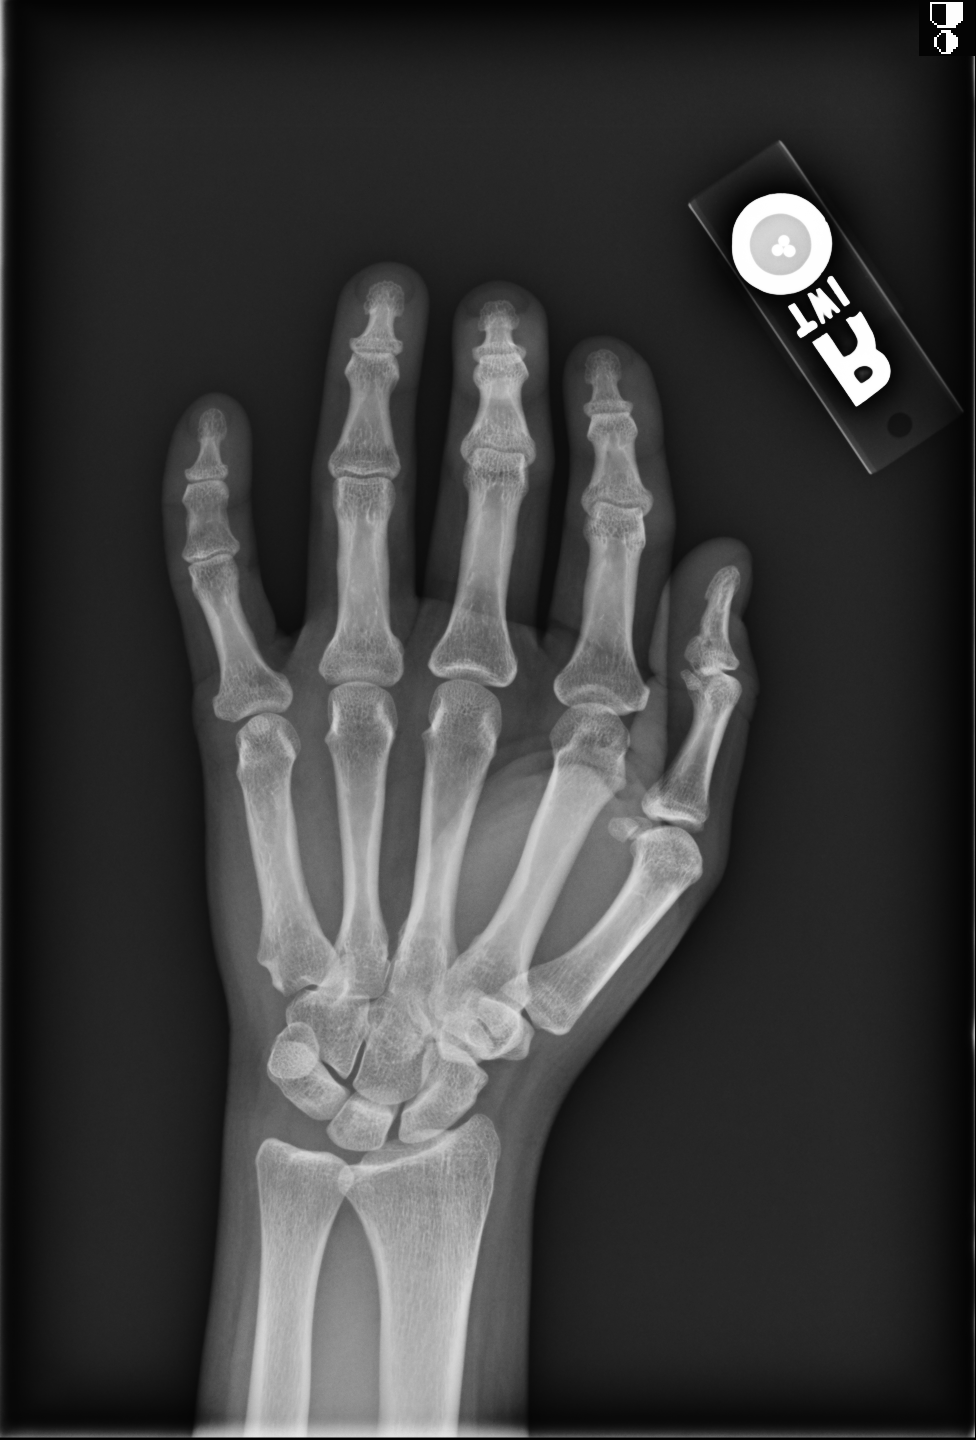
[im 2/3]
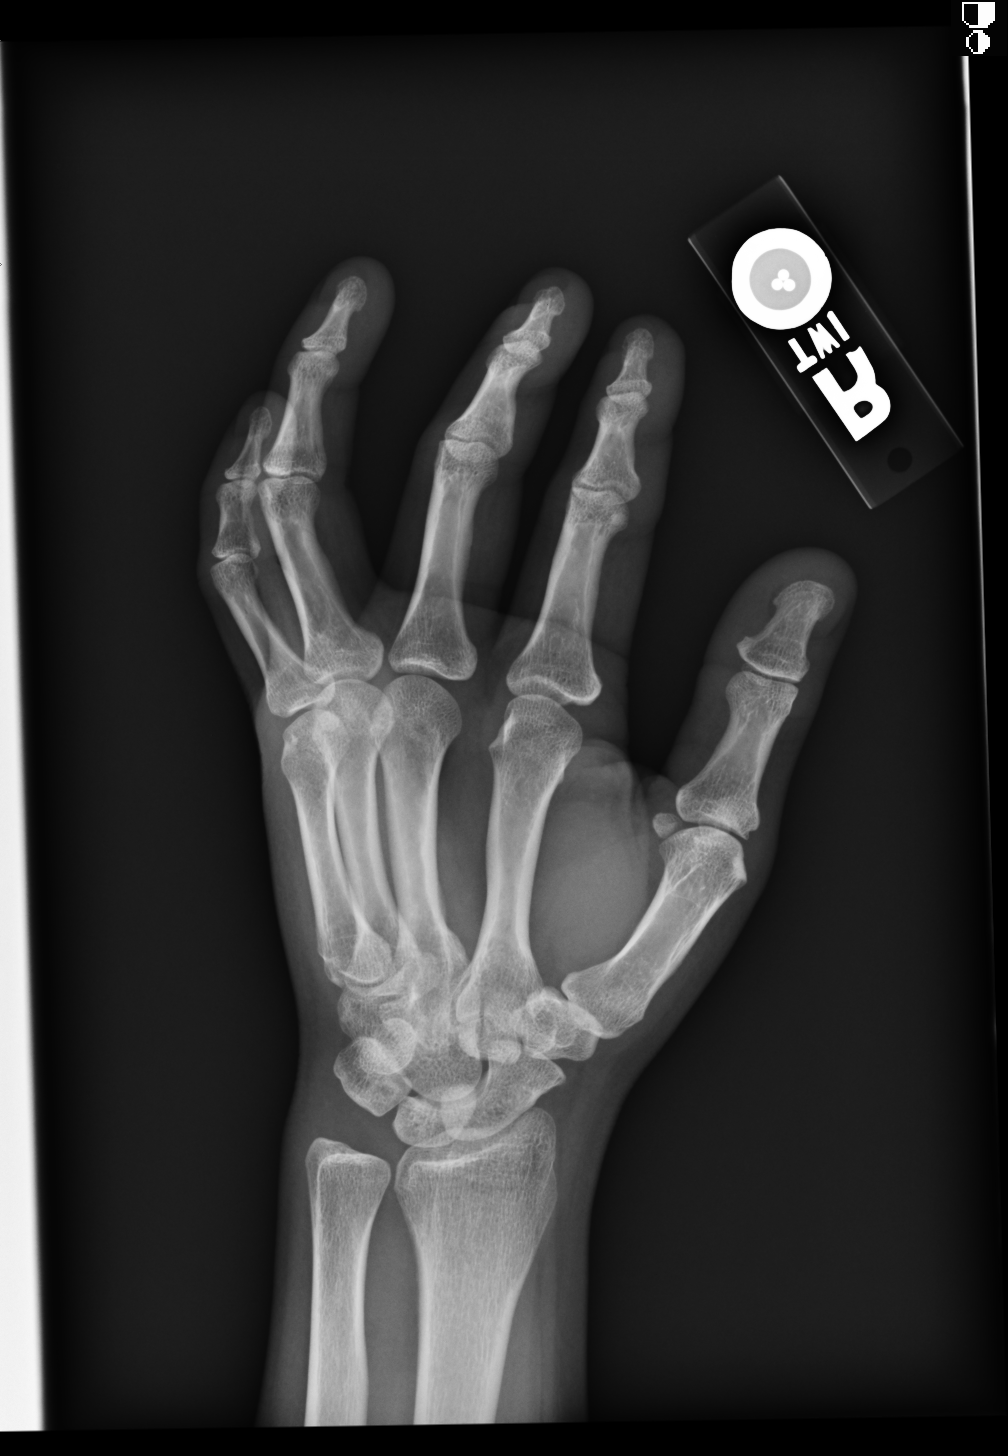
[im 3/3]
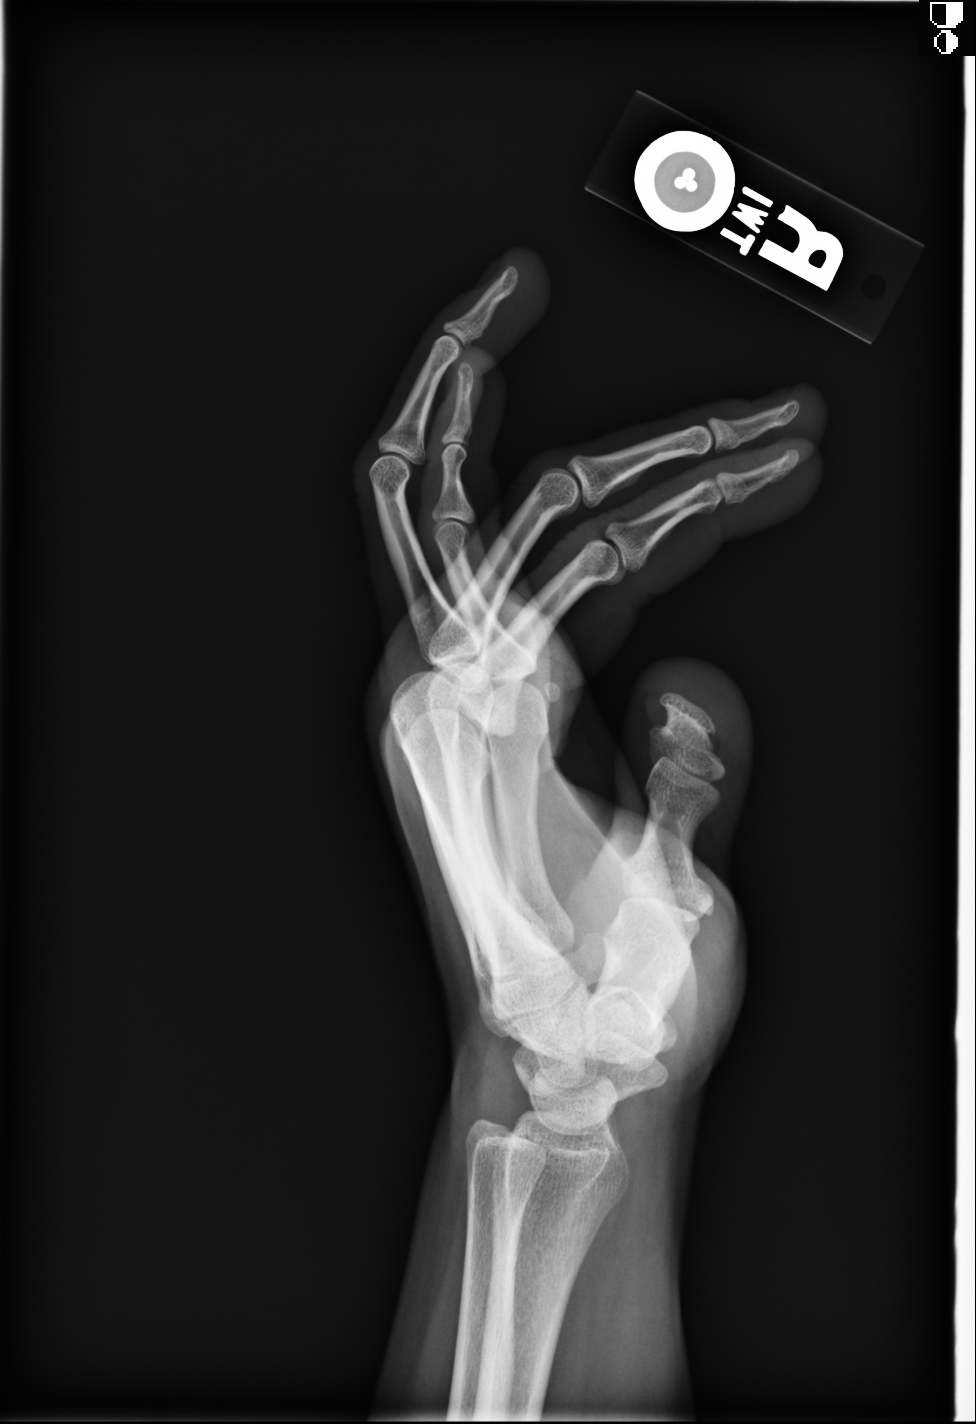

[3 of 3 positions shown; findings below may reference images not displayed]

FINDINGS: No acute fracture. Normal alignment.
IMPRESSION: No fracture.

[REDACTED]

## 2014-05-15 ENCOUNTER — Emergency Department (HOSPITAL_COMMUNITY): Payer: Medicaid Other

## 2014-05-15 ENCOUNTER — Emergency Department (HOSPITAL_COMMUNITY)
Admission: EM | Admit: 2014-05-15 | Discharge: 2014-05-15 | Disposition: A | Discharge status: Home / Self Care | Payer: Medicaid Other | Attending: Emergency Medicine | Admitting: Emergency Medicine

## 2014-05-15 ENCOUNTER — Encounter (HOSPITAL_COMMUNITY): Payer: Self-pay

## 2014-05-15 DIAGNOSIS — Z792 Long term (current) use of antibiotics: Secondary | ICD-10-CM | POA: Insufficient documentation

## 2014-05-15 DIAGNOSIS — J189 Pneumonia, unspecified organism: Secondary | ICD-10-CM

## 2014-05-15 DIAGNOSIS — J159 Unspecified bacterial pneumonia: Secondary | ICD-10-CM | POA: Diagnosis not present

## 2014-05-15 DIAGNOSIS — Z720 Tobacco use: Secondary | ICD-10-CM | POA: Diagnosis not present

## 2014-05-15 DIAGNOSIS — R079 Chest pain, unspecified: Secondary | ICD-10-CM | POA: Diagnosis present

## 2014-05-15 DIAGNOSIS — Z3202 Encounter for pregnancy test, result negative: Secondary | ICD-10-CM | POA: Diagnosis not present

## 2014-05-15 DIAGNOSIS — Z793 Long term (current) use of hormonal contraceptives: Secondary | ICD-10-CM | POA: Diagnosis not present

## 2014-05-15 DIAGNOSIS — Z880 Allergy status to penicillin: Secondary | ICD-10-CM | POA: Insufficient documentation

## 2014-05-15 LAB — I-STAT VENOUS BLOOD GAS, ED
ACID-BASE EXCESS: 1 mmol/L (ref 0.0–2.0)
Bicarbonate: 25.7 mEq/L — ABNORMAL HIGH (ref 20.0–24.0)
O2 SAT: 75 %
TCO2: 27 mmol/L (ref 0–100)
pCO2, Ven: 39 mmHg — ABNORMAL LOW (ref 45.0–50.0)
pH, Ven: 7.426 — ABNORMAL HIGH (ref 7.250–7.300)
pO2, Ven: 39 mmHg (ref 30.0–45.0)

## 2014-05-15 LAB — D-DIMER, QUANTITATIVE: D-Dimer, Quant: 0.77 ug/mL-FEU — ABNORMAL HIGH (ref 0.00–0.48)

## 2014-05-15 LAB — BASIC METABOLIC PANEL
ANION GAP: 10 (ref 5–15)
BUN: <5 mg/dL — ABNORMAL LOW (ref 6–23)
CHLORIDE: 106 mmol/L (ref 96–112)
CO2: 22 mmol/L (ref 19–32)
CREATININE: 0.79 mg/dL (ref 0.50–1.10)
Calcium: 8.9 mg/dL (ref 8.4–10.5)
GFR calc Af Amer: >90 mL/min (ref >90)
GFR calc non Af Amer: >90 mL/min (ref >90)
GLUCOSE: 102 mg/dL — AB (ref 70–99)
Potassium: 3.8 mmol/L (ref 3.5–5.1)
Sodium: 138 mmol/L (ref 135–145)

## 2014-05-15 LAB — CBC
HEMATOCRIT: 36.7 % (ref 36.0–46.0)
HEMOGLOBIN: 11.7 g/dL — AB (ref 12.0–15.0)
MCH: 26.3 pg (ref 26.0–34.0)
MCHC: 31.9 g/dL (ref 30.0–36.0)
MCV: 82.5 fL (ref 78.0–100.0)
Platelets: 254 10*3/uL (ref 150–400)
RBC: 4.45 MIL/uL (ref 3.87–5.11)
RDW: 14.9 % (ref 11.5–15.5)
WBC: 7.8 10*3/uL (ref 4.0–10.5)

## 2014-05-15 LAB — I-STAT BETA HCG BLOOD, ED (MC, WL, AP ONLY)

## 2014-05-15 LAB — I-STAT TROPONIN, ED: TROPONIN I, POC: 0 ng/mL (ref 0.00–0.08)

## 2014-05-15 MED ORDER — FENTANYL CITRATE 0.05 MG/ML IJ SOLN
25.0000 ug | Freq: Once | INTRAMUSCULAR | Status: AC
  Administered 2014-05-15: 25 ug via INTRAVENOUS
  Filled 2014-05-15: qty 2

## 2014-05-15 MED ORDER — LEVOFLOXACIN 750 MG PO TABS: 750.0000 mg | ORAL_TABLET | Freq: Every day | ORAL | Status: DC

## 2014-05-15 MED ORDER — ACETAMINOPHEN 500 MG PO TABS
1000.0000 mg | ORAL_TABLET | Freq: Once | ORAL | Status: AC
  Administered 2014-05-15: 1000 mg via ORAL
  Filled 2014-05-15: qty 2

## 2014-05-15 MED ORDER — LEVOFLOXACIN IN D5W 750 MG/150ML IV SOLN
750.0000 mg | Freq: Once | INTRAVENOUS | Status: AC
  Administered 2014-05-15: 750 mg via INTRAVENOUS
  Filled 2014-05-15: qty 150

## 2014-05-15 MED ORDER — ALBUTEROL (5 MG/ML) CONTINUOUS INHALATION SOLN
10.0000 mg/h | INHALATION_SOLUTION | Freq: Once | RESPIRATORY_TRACT | Status: AC
  Administered 2014-05-15: 10 mg/h via RESPIRATORY_TRACT
  Filled 2014-05-15: qty 20

## 2014-05-15 MED ORDER — ALBUTEROL SULFATE (2.5 MG/3ML) 0.083% IN NEBU: 2.5000 mg | INHALATION_SOLUTION | RESPIRATORY_TRACT | Status: DC | PRN

## 2014-05-15 MED ORDER — IPRATROPIUM BROMIDE 0.02 % IN SOLN
0.5000 mg | Freq: Once | RESPIRATORY_TRACT | Status: AC
  Administered 2014-05-15: 0.5 mg via RESPIRATORY_TRACT
  Filled 2014-05-15: qty 2.5

## 2014-05-15 MED ORDER — IOHEXOL 350 MG/ML SOLN
100.0000 mL | Freq: Once | INTRAVENOUS | Status: AC | PRN
  Administered 2014-05-15: 100 mL via INTRAVENOUS

## 2014-05-15 MED ORDER — HYDROCOD POLST-CHLORPHEN POLST 10-8 MG/5ML PO LQCR: 5.0000 mL | Freq: Two times a day (BID) | ORAL | Status: DC | PRN

## 2014-05-15 MED ORDER — ONDANSETRON HCL 4 MG/2ML IJ SOLN
4.0000 mg | Freq: Once | INTRAMUSCULAR | Status: AC
  Administered 2014-05-15: 4 mg via INTRAVENOUS
  Filled 2014-05-15: qty 2

## 2014-05-15 MED ORDER — OXYCODONE-ACETAMINOPHEN 5-325 MG PO TABS: 1.0000 | ORAL_TABLET | Freq: Four times a day (QID) | ORAL | Status: DC | PRN

## 2014-05-15 MED ORDER — OXYCODONE-ACETAMINOPHEN 5-325 MG PO TABS
2.0000 | ORAL_TABLET | Freq: Once | ORAL | Status: AC
  Administered 2014-05-15: 2 via ORAL
  Filled 2014-05-15: qty 2

## 2014-05-15 NOTE — ED Notes (Signed)
CT aware that pt is ready for transport 

## 2014-05-15 NOTE — ED Notes (Signed)
Lab results reported to Nurse. 

## 2014-05-15 NOTE — Progress Notes (Signed)
MD ordered CAT with 10 mg Albuterol and 0.5 Atrovent. Medication put into nebulizer and was about to put it on pt and MD decided he wanted to wait and see how pt did without it and what her O2 requirements were. Pt on Room Air at this time. Pt states she "can't tell if breathing better because it hurts". RT will continue to monitor.

## 2014-05-15 NOTE — Discharge Instructions (Signed)

## 2014-05-15 NOTE — ED Provider Notes (Signed)
CSN: [id number]     Arrival date & time 05/15/14  1017 History   First MD Initiated Contact with Patient 05/15/14 1022     Chief Complaint  Patient presents with  . Chest Pain  . Shortness of Breath     (Consider location/radiation/quality/duration/timing/severity/associated sxs/prior Treatment) Patient is a 29 y.o. female presenting with chest pain and shortness of breath. The history is provided by the patient.  Chest Pain Pain location:  Substernal area Pain quality: sharp   Pain radiates to:  Does not radiate Pain radiates to the back: no   Pain severity:  Severe Onset quality:  Sudden Timing:  Constant Progression:  Unchanged Chronicity:  New Context: at rest   Relieved by:  Nothing Worsened by:  Deep breathing Ineffective treatments:  None tried Associated symptoms: cough (productive), fever (102.7 this morning) and shortness of breath   Associated symptoms: no abdominal pain, no fatigue and not vomiting   Associated symptoms comment:  General malaise today Risk factors: birth control   Shortness of Breath Associated symptoms: chest pain, cough (productive) and fever (102.7 this morning)   Associated symptoms: no abdominal pain and no vomiting     No past medical history on file. Past Surgical History  Procedure Laterality Date  . Appendectomy    . Cholecystectomy    . Tubal ligation     No family history on file. History  Substance Use Topics  . Smoking status: Current Every Day Smoker -- 0.75 packs/day for 17 years    Types: Cigarettes  . Smokeless tobacco: Not on file  . Alcohol Use: No   OB History    Gravida Para Term Preterm AB TAB SAB Ectopic Multiple Living   Review of Systems  Constitutional: Positive for fever (102.7 this morning). Negative for fatigue.  Respiratory: Positive for cough (productive) and shortness of breath.   Cardiovascular: Positive for chest pain.  Gastrointestinal: Negative for vomiting and abdominal pain.   All other systems reviewed and are negative.     Allergies  Penicillins and Tramadol  Home Medications   Prior to Admission medications   Medication Sig Start Date End Date Taking? Authorizing Provider  ciprofloxacin (CIPRO) 500 MG tablet Take 1 tablet (500 mg total) by mouth 2 (two) times daily. 09/08/13   Lisa A Leftwich-Kirby, CNM  ibuprofen (ADVIL,MOTRIN) 600 MG tablet Take 1 tablet (600 mg total) by mouth every 6 (six) hours as needed. 09/08/13   Wilmer Floor Leftwich-Kirby, CNM  norgestimate-ethinyl estradiol (ORTHO-CYCLEN,SPRINTEC,PREVIFEM) 0.25-35 MG-MCG tablet Take 1 tablet by mouth daily. 09/08/13   Wilmer Floor Leftwich-Kirby, CNM  oxyCODONE-acetaminophen (PERCOCET/ROXICET) 5-325 MG per tablet Take 1-2 tablets by mouth every 6 (six) hours as needed. 09/08/13   Wilmer Floor Leftwich-Kirby, CNM   SpO2 87% Physical Exam  Constitutional: She is oriented to person, place, and time. She appears well-developed and well-nourished. No distress.  HENT:  Head: Normocephalic and atraumatic.  Mouth/Throat: Oropharynx is clear and moist.  Eyes: EOM are normal. Pupils are equal, round, and reactive to light.  Neck: Normal range of motion. Neck supple.  Cardiovascular: Normal rate and regular rhythm.  Exam reveals no friction rub.   No murmur heard. Pulmonary/Chest: Effort normal. No respiratory distress. She has decreased breath sounds (diminshed on the left). She has wheezes (diffuse). She has rhonchi (bilateral mid and basilar). She has no rales.  Abdominal: Soft. She exhibits no distension. There is no tenderness. There  is no rebound.  Musculoskeletal: Normal range of motion. She exhibits no edema.  Neurological: She is alert and oriented to person, place, and time.  Skin: She is not diaphoretic.  Nursing note and vitals reviewed.   ED Course  Procedures (including critical care time) Labs Review Labs Reviewed  CBC  BASIC METABOLIC PANEL  D-DIMER, QUANTITATIVE  I-STAT TROPOININ, ED    Imaging  Review Ct Angio Chest Pe W/cm &/or Wo Cm  05/15/2014   CLINICAL DATA:  Extreme shortness of breath and diffuse chest pain for several days.  EXAM: CT ANGIOGRAPHY CHEST WITH CONTRAST  TECHNIQUE: Multidetector CT imaging of the chest was performed using the standard protocol during bolus administration of intravenous contrast. Multiplanar CT image reconstructions and MIPs were obtained to evaluate the vascular anatomy.  CONTRAST:  100mL OMNIPAQUE IOHEXOL 350 MG/ML SOLN  COMPARISON:  None.  FINDINGS: THORACIC INLET/BODY WALL:  No acute abnormality.  MEDIASTINUM:  Normal heart size. No pericardial effusion. No acute vascular abnormality, including pulmonary embolism. No adenopathy.  LUNG WINDOWS:  Airspace disease patchy throughout the left lower lobe with bronchial wall thickening and luminal opacification. No effusion or cavitation.  UPPER ABDOMEN:  No acute findings.  OSSEOUS:  No acute fracture.  No suspicious lytic or blastic lesions.  Review of the MIP images confirms the above findings.  IMPRESSION: 1. Left lower lobe pneumonia. 2. Negative for pulmonary embolism.   Electronically Signed   By: Marnee SpringJonathon  Watts M.D.   On: 05/15/2014 12:59   Dg Chest Portable 1 View  05/15/2014   CLINICAL DATA:  Chest pain and shortness of breath.  EXAM: PORTABLE CHEST - 1 VIEW  COMPARISON:  03/08/2013  FINDINGS: There is a hazy infiltrate at the left lung base. Right lung is clear. Heart size and vascularity are normal. No osseous abnormality.  IMPRESSION: Infiltrate at the left lung base.   Electronically Signed   By: Francene BoyersJames  Maxwell M.D.   On: 05/15/2014 10:37     EKG Interpretation   Date/Time:  Friday May 15 2014 10:27:20 EDT Ventricular Rate:  109 PR Interval:  140 QRS Duration: 79 QT Interval:  303 QTC Calculation: 408 R Axis:   54 Text Interpretation:  Sinus tachycardia Probable left atrial enlargement  Baseline wander in lead(s) V3 No prior for comparison Confirmed by Gwendolyn GrantWALDEN   MD, Joni Norrod ([id number]) on 05/15/2014  10:34:56 AM      MDM   Final diagnoses:  Community acquired pneumonia    35F here with SOB and CP. CP began this morning, central, sharp. Nonradiating. SOB also. Had fever and cough this morning. Tmax 102.7. Patient with decreased breath sounds on the L per EMS, was diminished on the left, but does have L sided breath sounds. Improved aeration per EMS with duoneb.  Portable chest xray shows L sided pleural effusion. Haziness in LLL area. Clinically patient has pneumonia. Will obtain cultures and start antibiotics.  D-dimer elevated, CT shows pneumonia, no evidence of PE.  No hypoxia with ambulation. Vitals stable. Patient well appearing, no respiratory distress. Given levaquin and I feel she is stable for discharge.  Elwin MochaBlair Taisley Mordan, MD 05/16/14 [id number]

## 2014-05-15 NOTE — ED Notes (Signed)
Pt here for general sickness, sob, chest pain and decreased/absent breath sounds on left side. Per dr Gwendolyn Grantwalden on arrival pt with diminished breath sounds. Duoneb in progress on arrival. X 2 , 125 mg solumedrol. 20 g left ac and 20 in right hand.

## 2014-05-21 LAB — CULTURE, BLOOD (ROUTINE X 2)
CULTURE: NO GROWTH
Culture: NO GROWTH

## 2014-06-06 IMAGING — US US PELV - US TRANSVAGINAL
1 series · 14 of 25 positions shown · non-contrast
Comparison: none

REASON FOR EXAM: left adnexal tenderness
COMMENTS:

[Series 1: us pelv - us transvaginal · 0.28mm/px · 14 of 111 slices shown]
[im 1/111]
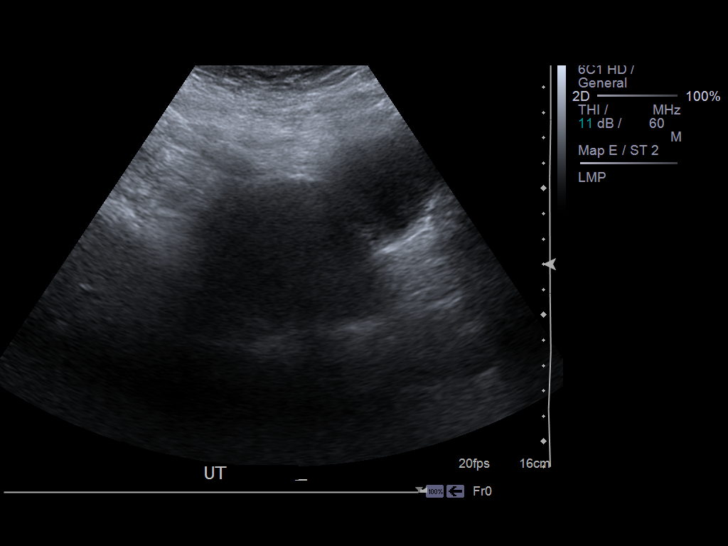
[im 10/111]
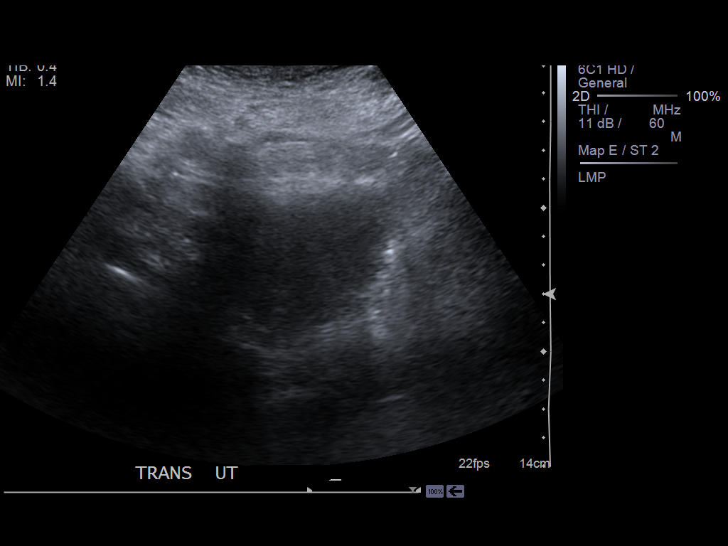
[im 19/111]
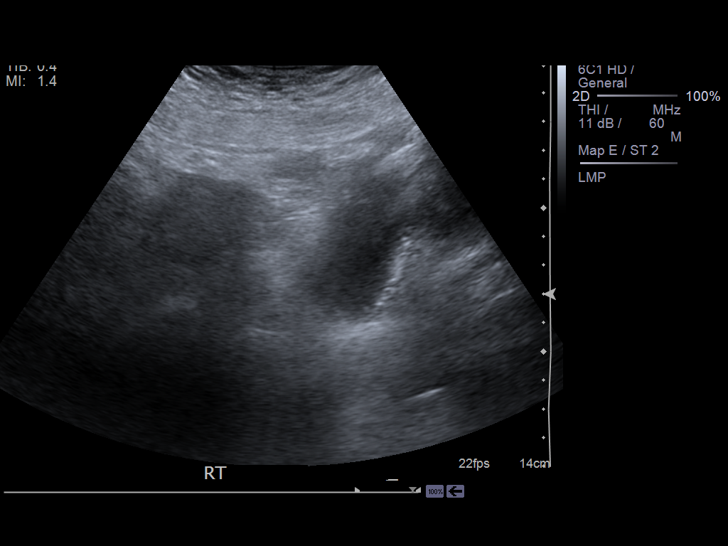
[im 28/111]
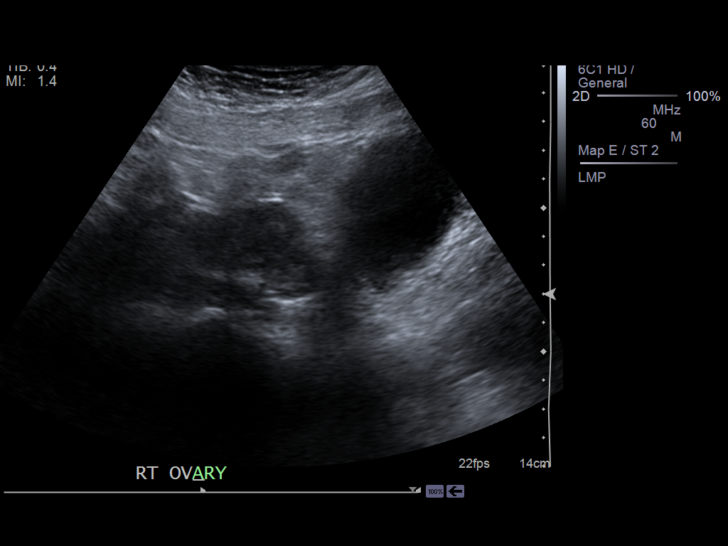
[im 37/111]
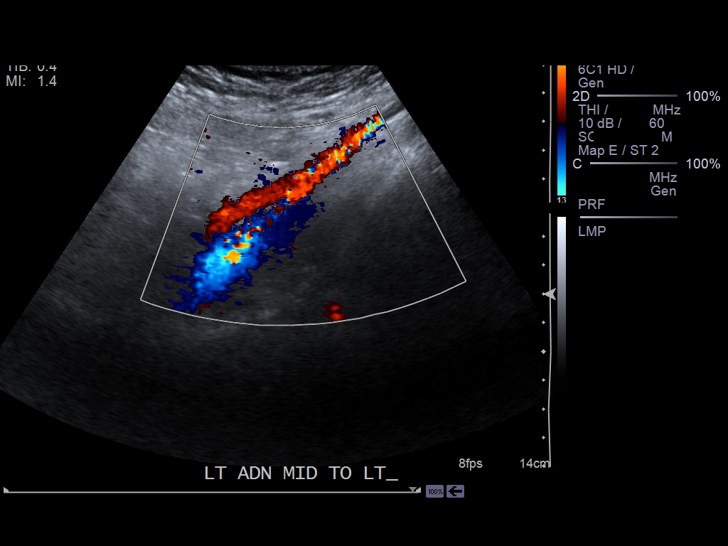
[im 42/111]
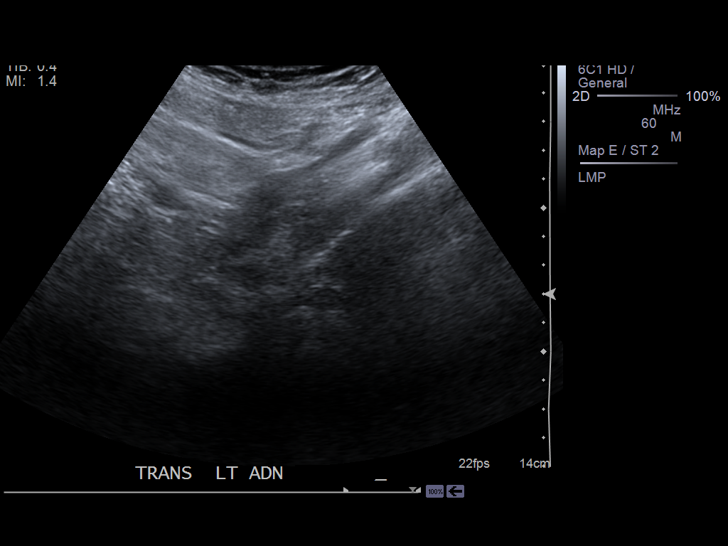
[im 51/111]
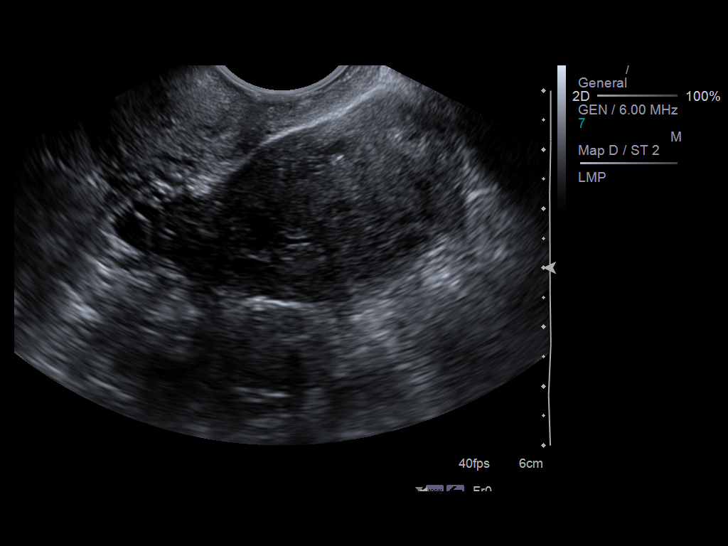
[im 60/111]
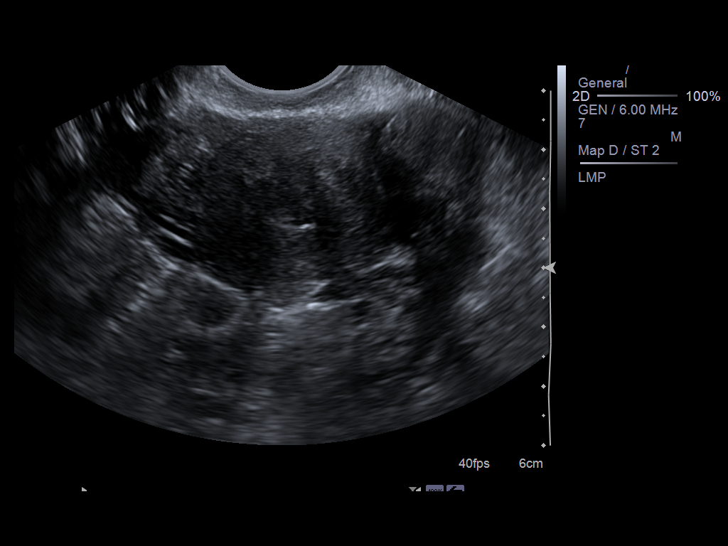
[im 69/111]
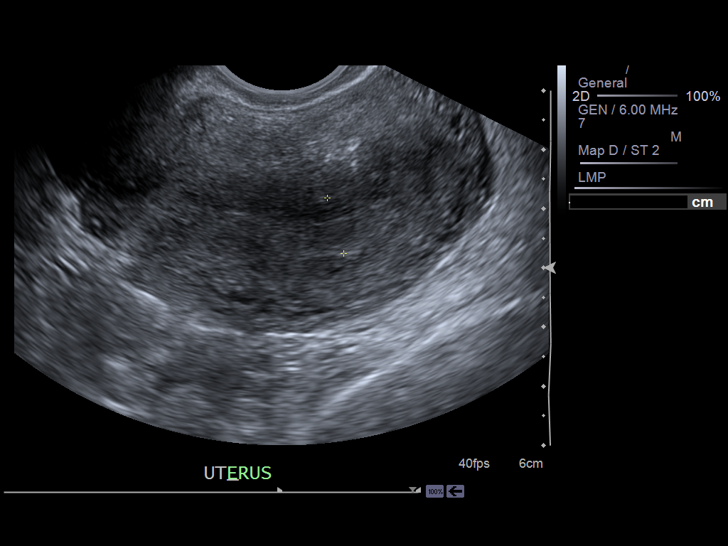
[im 74/111]
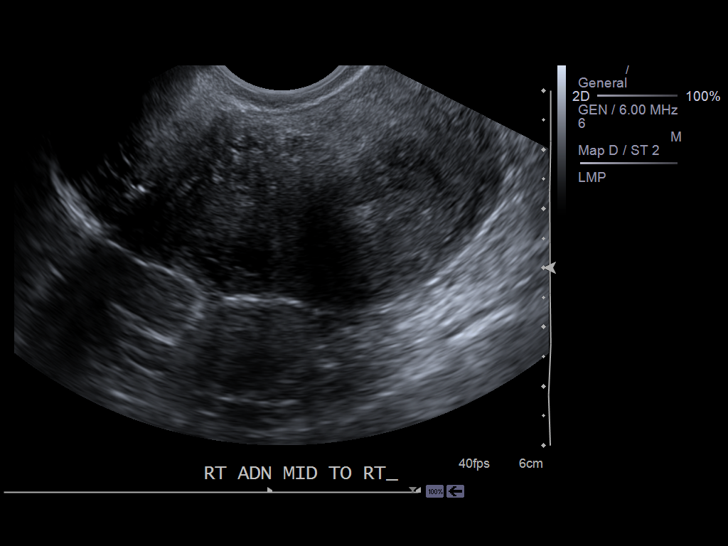
[im 83/111]
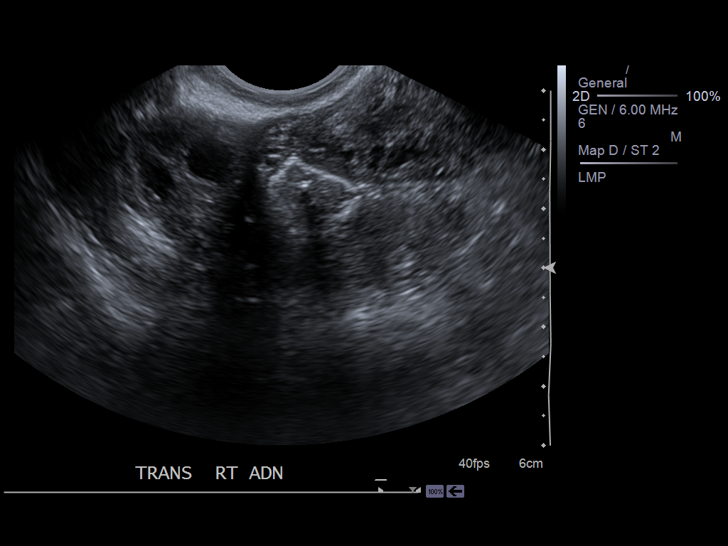
[im 92/111]
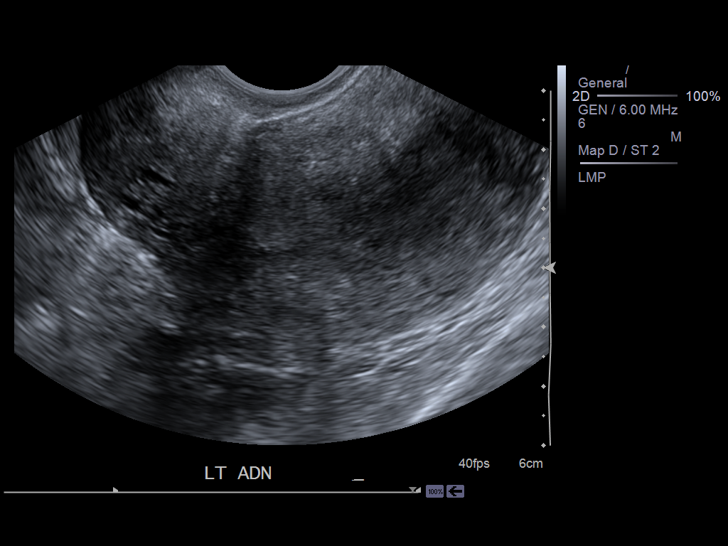
[im 101/111]
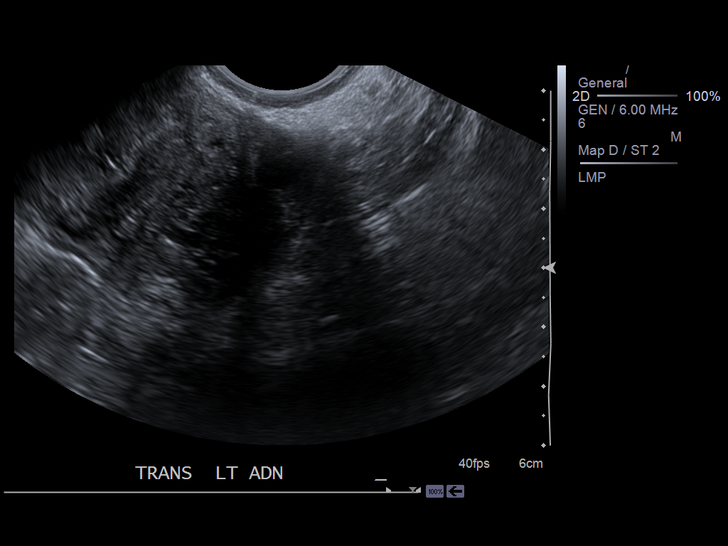
[im 111/111]
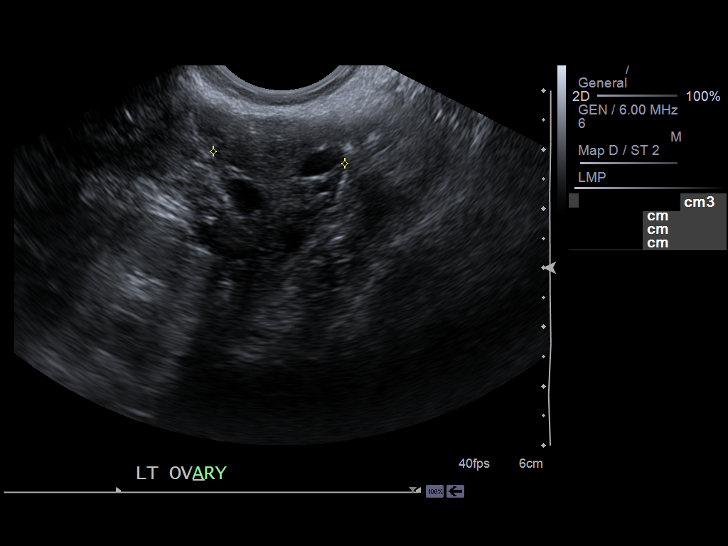

[14 of 25 positions shown; findings below may reference images not displayed]

PROCEDURE:     US  - US PELVIS EXAM W/TRANSVAGINAL  - September 24, 2012  [DATE]

RESULT:     Pelvic ultrasound is performed utilizing transabdominal and
endovaginal scanning. The patient has a previous exam for comparison from
07/22/2012. The complex cystic mass seen previously in the left ovary has
resolved. There appear to be follicles in both ovaries a normal fashion.
Endometrial stripe thickness is 9.8 mm. The uterus measures 7.54 x 6.56 x
4.95 cm. Blood flow is present in both ovaries. The right ovary measures
3.62 x 2.16 x 2.59 cm. The left ovary measures 3.93 x 2.25 x 2.23 cm. There
is no myometrial mass. There is no ascites or abnormal fluid collection.
IMPRESSION: Normal appearing pelvic sonogram. Resolution of the complex
mass in the left ovary since the previous study.

[REDACTED]

## 2014-06-22 ENCOUNTER — Emergency Department
Admission: EM | Admit: 2014-06-22 | Discharge: 2014-06-22 | Disposition: A | Discharge status: Home / Self Care | Payer: Medicaid Other | Attending: Emergency Medicine | Admitting: Emergency Medicine

## 2014-06-22 DIAGNOSIS — Z880 Allergy status to penicillin: Secondary | ICD-10-CM | POA: Insufficient documentation

## 2014-06-22 DIAGNOSIS — L0100 Impetigo, unspecified: Secondary | ICD-10-CM | POA: Insufficient documentation

## 2014-06-22 DIAGNOSIS — Z720 Tobacco use: Secondary | ICD-10-CM | POA: Diagnosis not present

## 2014-06-22 DIAGNOSIS — Z22322 Carrier or suspected carrier of Methicillin resistant Staphylococcus aureus: Secondary | ICD-10-CM | POA: Insufficient documentation

## 2014-06-22 DIAGNOSIS — Z793 Long term (current) use of hormonal contraceptives: Secondary | ICD-10-CM | POA: Insufficient documentation

## 2014-06-22 DIAGNOSIS — R21 Rash and other nonspecific skin eruption: Secondary | ICD-10-CM | POA: Diagnosis present

## 2014-06-22 MED ORDER — SULFAMETHOXAZOLE-TRIMETHOPRIM 800-160 MG PO TABS: 1.0000 | ORAL_TABLET | Freq: Two times a day (BID) | ORAL | Status: DC

## 2014-06-22 MED ORDER — OXYCODONE-ACETAMINOPHEN 5-325 MG PO TABS
ORAL_TABLET | ORAL | Status: AC
  Filled 2014-06-22: qty 1

## 2014-06-22 MED ORDER — MUPIROCIN 2 % EX OINT: TOPICAL_OINTMENT | CUTANEOUS | Status: AC

## 2014-06-22 MED ORDER — OXYCODONE-ACETAMINOPHEN 5-325 MG PO TABS
1.0000 | ORAL_TABLET | Freq: Once | ORAL | Status: AC
Start: 2014-06-22 — End: 2014-06-22
  Administered 2014-06-22: 1 via ORAL

## 2014-06-22 MED ORDER — OXYCODONE-ACETAMINOPHEN 5-325 MG PO TABS: 1.0000 | ORAL_TABLET | ORAL | Status: DC | PRN

## 2014-06-22 NOTE — ED Notes (Signed)
Pt c/o having a hx of staph infection previously.  C/o rash to the upper lip that is swollen and painful for the past couple of days

## 2014-06-22 NOTE — Discharge Instructions (Signed)
MRSA Infection MRSA stands for methicillin-resistant Staphylococcus aureus. This type of infection is caused by Staphylococcus aureus bacteria that are no longer affected by the medicines used to kill them (drug resistant). Staphylococcus (staph) bacteria are normally found on the skin or in the nose of healthy people. In most cases, these bacteria do not cause infection. But if these resistant bacteria enter your body through a cut or sore, they can cause a serious infection on your skin or in other parts of your body. There is a slight chance that the staph on your skin or in your nose is MRSA. There are two types of MRSA infections:  Hospital-acquired MRSA is bacteria that you get in the hospital.  Community-acquired MRSA is bacteria that you get somewhere other than in a hospital. RISK FACTORS Hospital-acquired MRSA is more common. You could be at risk for this infection if you are in the hospital and you:  Have surgery or a procedure.  Have an IV access or a catheter tube placed in your body.  Have weak resistance to germs (weakened immune system).  Are elderly.  Are on kidney dialysis. You could be at risk for community-acquired MRSA if you have a break in your skin and come into contact with MRSA. This may happen if you:  Play sports where there is skin-to-skin contact.  Live in a crowded setting, like a dormitory or a military barracks.  Share towels, razors, or sports equipment with other people. SYMPTOMS  Symptoms of hospital-acquired MRSA depend on where MRSA has spread. Symptoms may include:  Wound infection.  Skin infection.  Rash.  Pneumonia.  Fever and chills.  Difficulty breathing.  Chest pain. Community-acquired MRSA is most likely to start as a scratch or cut that becomes infected. Symptoms may include:  A pus-filled pimple.  A boil on your skin.  Pus draining from your skin.  A sore (abscess) under your skin or somewhere in your body.  Fever  with or without chills. DIAGNOSIS  The diagnosis of MRSA is made by taking a sample from an infected area and sending it to a lab for testing. A lab technician can grow (culture) MRSA and check it under a microscope. The cultured MRSA can be tested to see which type of antibiotic medicine will work to treat it. Newer tests can identify MRSA more quickly by testing bacteria samples for MRSA genes. Your health care provider can diagnose MRSA using samples from:   Cuts or wounds in infected areas.  Nasal swabs.  Saliva or cough specimens from deep in the lungs (sputum).  Urine.  Blood. You may also have:  Imaging studies (such as X-ray or MRI) to check if the infection has spread to the lungs, bones, or joints.  A culture and sensitivity test of blood or fluids from inside the joints. TREATMENT  Treatment depends on how severe, deep, or extensive the infection is. Very bad infections may require a hospital stay.  Some skin infections, such as a small boil or sore (abscess), may be treated by draining pus from the site of the infection.  More extensive surgery to drain pus may be necessary for deeper or more widespread soft tissue infections.  You may then have to take antibiotic medicine given by mouth or through a vein. You may start antibiotic treatment right away or after testing can be done to see what antibiotic medicine should be used. HOME CARE INSTRUCTIONS   Take your antibiotics as directed by your health care provider. Take   the medicine as prescribed until it is finished.  Avoid close contact with those around you as much as possible. Do not use towels, razors, toothbrushes, bedding, or other items that will be used by others.  Wash your hands frequently for 15 seconds with soap and water. Dry your hands with a clean or disposable towel.  When you are not able to wash your hands, use hand sanitizer that is more than 60 percent alcohol.  Wash towels, sheets, or clothes in  the washing machine with detergent and hot water. Dry them in a hot dryer.  Follow your health care provider's instructions for wound care. Wash your hands before and after changing your bandages.  Always shower after exercising.  Keep all cuts and scrapes clean and covered with a bandage.  Be sure to tell all your health care providers that you have MRSA so they are aware of your infection. SEEK MEDICAL CARE IF:  You have a cut, scrape, pimple, or boil that becomes red, swollen, or painful or has pus in it.  You have pus draining from your skin.  You have an abscess under your skin or somewhere in your body. SEEK IMMEDIATE MEDICAL CARE IF:   You have symptoms of a skin infection with a fever or chills.  You have trouble breathing.  You have chest pain.  You have a skin wound and you become nauseous or start vomiting. MAKE SURE YOU:  Understand these instructions.  Will watch your condition.  Will get help right away if you are not doing well or get worse. Document Released: 01/30/2005 Document Revised: 02/04/2013 Document Reviewed: 11/22/2012 [employer] Patient Information 2015 [employer]. This information is not intended to replace advice given to you by your health care provider. Make sure you discuss any questions you have with your health care provider.  

## 2014-06-22 NOTE — ED Provider Notes (Signed)
[facility] Emergency Department Provider Note  ____________________________________________  Time seen: ----------------------------------------- 1:00 PM on 06/22/2014 -----------------------------------------  I have reviewed the triage vital signs and the nursing notes.   HISTORY  Chief Complaint Rash   HPI Lynford CitizenHeather N Deady is a 29 y.o. female who presents to the ER with c/o rash x 3 days. Patient states that she has had two other similar rashes in the past 2 years, once on R nostril and once on chin, and both have been treated as staph infections. Current rash presents the same, this time on L nostril. Pt reports the rash started as a small red spot and has grown and crusted over since then. Reports the rash is painful to the touch and hurts with movement of her lip/nose. Rates her pain at 8/10. Has not taken anything for pain, has not applied any treatment to the area. Reports that the discomfort and the spread of the rash have grown since first noticing it 3 days ago.   Pt sees Dr. Bascom LevelsNiedermeyer as her PCP.    History reviewed. No pertinent past medical history.   There are no active problems to display for this patient.   Past Surgical History  Procedure Laterality Date  . Appendectomy    . Cholecystectomy    . Tubal ligation      Current Outpatient Rx  Name  Route  Sig  Dispense  Refill  . acetaminophen (TYLENOL) 500 MG tablet   Oral   Take 500 mg by mouth every 6 (six) hours as needed for mild pain, moderate pain or headache.         . albuterol (PROVENTIL) (2.5 MG/3ML) 0.083% nebulizer solution   Nebulization   Take 3 mLs (2.5 mg total) by nebulization every 4 (four) hours as needed for wheezing or shortness of breath.   30 vial   0   . chlorpheniramine-HYDROcodone (TUSSIONEX PENNKINETIC ER) 10-8 MG/5ML LQCR   Oral   Take 5 mLs by mouth every 12 (twelve) hours as needed for cough.   115 mL   0   . ibuprofen (ADVIL,MOTRIN) 600 MG  tablet   Oral   Take 1 tablet (600 mg total) by mouth every 6 (six) hours as needed.   30 tablet   0   . mupirocin ointment (BACTROBAN) 2 %      Apply to affected area 3 times daily   22 g   0   . norgestimate-ethinyl estradiol (ORTHO-CYCLEN,SPRINTEC,PREVIFEM) 0.25-35 MG-MCG tablet   Oral   Take 1 tablet by mouth daily.   1 Package   11   . oxyCODONE-acetaminophen (ROXICET) 5-325 MG per tablet   Oral   Take 1-2 tablets by mouth every 4 (four) hours as needed for severe pain.   15 tablet   0   . sulfamethoxazole-trimethoprim (BACTRIM DS,SEPTRA DS) 800-160 MG per tablet   Oral   Take 1 tablet by mouth 2 (two) times daily.   20 tablet   0     Allergies Penicillins and Ultram. Reports stomach upset with tramadol/toradol.   No family history on file.  Social History History  Substance Use Topics  . Smoking status: Current Every Day Smoker -- 0.75 packs/day for 17 years    Types: Cigarettes  . Smokeless tobacco: Not on file  . Alcohol Use: No    Review of Systems Constitutional: No fever/chills. Some sweats with pain from rash.  Eyes: No visual changes. ENT: No sore throat. Painful rash to L  nostril.  Cardiovascular: Denies chest pain. Respiratory: Denies shortness of breath. Gastrointestinal: No abdominal pain.  No nausea, no vomiting.  No diarrhea.  No constipation. Genitourinary: Negative for dysuria. Musculoskeletal: Negative for back pain. Skin: Negative for rash. Neurological: Negative for headaches, focal weakness or numbness.  10-point ROS otherwise negative.  ____________________________________________   PHYSICAL EXAM:  VITAL SIGNS: ED Triage Vitals  Enc Vitals Group     BP 06/22/14 1121 117/81 mmHg     Pulse Rate 06/22/14 1121 102     Resp 06/22/14 1121 15     Temp 06/22/14 1121 98.4 F (36.9 C)     Temp Source 06/22/14 1121 Oral     SpO2 06/22/14 1121 100 %     Weight 06/22/14 1121 145 lb (65.772 kg)     Height 06/22/14 1121 5\' 1"   (1.549 m)     Head Cir --      Peak Flow --      Pain Score 06/22/14 1121 6     Pain Loc --      Pain Edu? --      Excl. in GC? --     Constitutional: Alert and oriented. Well appearing and in no acute distress. Eyes: Conjunctivae are normal. PERRL. EOMI. Head: Atraumatic. Nose: No congestion/rhinnorhea. Rash to L nostril appreciated, crusted in appearance and mildly erythematous.  Mouth/Throat: Mucous membranes are moist.  Oropharynx non-erythematous. Hematological/Lymphatic/Immunilogical: No cervical lymphadenopathy. Cardiovascular: Normal rate, regular rhythm. Grossly normal heart sounds.  Good peripheral circulation. Respiratory: Normal respiratory effort.  No retractions. Lungs CTAB. Gastrointestinal: Soft and nontender. No distention. No abdominal bruits. No CVA tenderness. Musculoskeletal: No lower extremity tenderness nor edema.  No joint effusions. Neurologic:  Normal speech and language. No gross focal neurologic deficits are appreciated. Speech is normal. No gait instability. Skin: Negative other than rash to L nostril.  Psychiatric: Mood and affect are normal. Speech and behavior are normal.  ____________________________________________   LABS (all labs ordered are listed, but only abnormal results are displayed)  Labs Reviewed - No data to display ____________________________________________  EKG  N/A ____________________________________________  RADIOLOGY  N/A ____________________________________________   PROCEDURES  Procedure(s) performed: None  Critical Care performed: No  ____________________________________________   INITIAL IMPRESSION / ASSESSMENT AND PLAN / ED COURSE  Pertinent labs & imaging results that were available during my care of the patient were reviewed by me and considered in my medical decision making (see chart for details).  Patient to follow up with any worsening of symptoms. No other EMC at this  time. ____________________________________________   FINAL CLINICAL IMPRESSION(S) / ED DIAGNOSES  Final diagnoses:  Impetigo  MRSA (methicillin resistant Staphylococcus aureus) carrier      Evangeline Dakinharles M Nalany Steedley, PA-C 06/22/14 1714  Myrna Blazeravid Matthew Schaevitz, MD 06/23/14 [phone]

## 2014-07-20 ENCOUNTER — Emergency Department
Admission: EM | Admit: 2014-07-20 | Discharge: 2014-07-20 | Disposition: A | Discharge status: Home / Self Care | Payer: Medicaid Other | Attending: Emergency Medicine | Admitting: Emergency Medicine

## 2014-07-20 ENCOUNTER — Emergency Department: Payer: Medicaid Other

## 2014-07-20 ENCOUNTER — Encounter: Payer: Self-pay | Admitting: Emergency Medicine

## 2014-07-20 DIAGNOSIS — Z720 Tobacco use: Secondary | ICD-10-CM | POA: Diagnosis not present

## 2014-07-20 DIAGNOSIS — Y9389 Activity, other specified: Secondary | ICD-10-CM | POA: Insufficient documentation

## 2014-07-20 DIAGNOSIS — W2209XA Striking against other stationary object, initial encounter: Secondary | ICD-10-CM | POA: Diagnosis not present

## 2014-07-20 DIAGNOSIS — Z79899 Other long term (current) drug therapy: Secondary | ICD-10-CM | POA: Diagnosis not present

## 2014-07-20 DIAGNOSIS — S6991XA Unspecified injury of right wrist, hand and finger(s), initial encounter: Secondary | ICD-10-CM | POA: Diagnosis present

## 2014-07-20 DIAGNOSIS — Z880 Allergy status to penicillin: Secondary | ICD-10-CM | POA: Insufficient documentation

## 2014-07-20 DIAGNOSIS — Y9289 Other specified places as the place of occurrence of the external cause: Secondary | ICD-10-CM | POA: Insufficient documentation

## 2014-07-20 DIAGNOSIS — S6000XA Contusion of unspecified finger without damage to nail, initial encounter: Secondary | ICD-10-CM

## 2014-07-20 DIAGNOSIS — S60011A Contusion of right thumb without damage to nail, initial encounter: Secondary | ICD-10-CM | POA: Insufficient documentation

## 2014-07-20 DIAGNOSIS — Y998 Other external cause status: Secondary | ICD-10-CM | POA: Diagnosis not present

## 2014-07-20 MED ORDER — OXYCODONE-ACETAMINOPHEN 5-325 MG PO TABS
1.0000 | ORAL_TABLET | Freq: Once | ORAL | Status: AC
  Administered 2014-07-20: 1 via ORAL

## 2014-07-20 MED ORDER — OXYCODONE-ACETAMINOPHEN 5-325 MG PO TABS: 1.0000 | ORAL_TABLET | ORAL | Status: AC | PRN

## 2014-07-20 MED ORDER — IBUPROFEN 800 MG PO TABS
800.0000 mg | ORAL_TABLET | Freq: Once | ORAL | Status: AC
  Administered 2014-07-20: 800 mg via ORAL

## 2014-07-20 MED ORDER — IBUPROFEN 800 MG PO TABS
ORAL_TABLET | ORAL | Status: AC
  Filled 2014-07-20: qty 1

## 2014-07-20 MED ORDER — OXYCODONE-ACETAMINOPHEN 5-325 MG PO TABS
ORAL_TABLET | ORAL | Status: AC
  Filled 2014-07-20: qty 1

## 2014-07-20 NOTE — ED Notes (Signed)
Few days ago shut right thumb in door, has swelling and pain

## 2014-07-20 NOTE — ED Notes (Signed)
Pt states on Saturday night she slammed right thumb in the door to her house, states she has had pain and bruising since to right thumb

## 2014-07-20 NOTE — ED Provider Notes (Signed)
[facility] Emergency Department Provider Note  ____________________________________________  Time seen: Approximately 7:50 AM  I have reviewed the triage vital signs and the nursing notes.   HISTORY  Chief Complaint Hand Pain    HPI Pamela Meyers is a 29 y.o. female pain to the right thumb secondary to a crush injury 2 days ago. Patient state her daughter close the door abruptly causing the patient's thumb backward in the door. Patient is pain and edema decreased range of motion secondary to pain. States she will use over-the-counter Motrin and ice which has not helped with the pain. Patient Is rating the pain as 8/10. Patient is right-hand dominant.   No past medical history on file.  There are no active problems to display for this patient.   Past Surgical History  Procedure Laterality Date  . Appendectomy    . Cholecystectomy    . Tubal ligation      Current Outpatient Rx  Name  Route  Sig  Dispense  Refill  . acetaminophen (TYLENOL) 500 MG tablet   Oral   Take 500 mg by mouth every 6 (six) hours as needed for mild pain, moderate pain or headache.         . albuterol (PROVENTIL) (2.5 MG/3ML) 0.083% nebulizer solution   Nebulization   Take 3 mLs (2.5 mg total) by nebulization every 4 (four) hours as needed for wheezing or shortness of breath.   30 vial   0   . chlorpheniramine-HYDROcodone (TUSSIONEX PENNKINETIC ER) 10-8 MG/5ML LQCR   Oral   Take 5 mLs by mouth every 12 (twelve) hours as needed for cough.   115 mL   0   . ibuprofen (ADVIL,MOTRIN) 600 MG tablet   Oral   Take 1 tablet (600 mg total) by mouth every 6 (six) hours as needed.   30 tablet   0   . mupirocin ointment (BACTROBAN) 2 %      Apply to affected area 3 times daily   22 g   0   . norgestimate-ethinyl estradiol (ORTHO-CYCLEN,SPRINTEC,PREVIFEM) 0.25-35 MG-MCG tablet   Oral   Take 1 tablet by mouth daily.   1 Package   11   . oxyCODONE-acetaminophen  (ROXICET) 5-325 MG per tablet   Oral   Take 1-2 tablets by mouth every 4 (four) hours as needed for severe pain.   15 tablet   0   . oxyCODONE-acetaminophen (ROXICET) 5-325 MG per tablet   Oral   Take 1 tablet by mouth every 4 (four) hours as needed for severe pain.   8 tablet   0   . sulfamethoxazole-trimethoprim (BACTRIM DS,SEPTRA DS) 800-160 MG per tablet   Oral   Take 1 tablet by mouth 2 (two) times daily.   20 tablet   0     Allergies Penicillins and Ultram  No family history on file.  Social History History  Substance Use Topics  . Smoking status: Current Every Day Smoker -- 0.75 packs/day for 17 years    Types: Cigarettes  . Smokeless tobacco: Not on file  . Alcohol Use: No    Review of Systems Constitutional: No fever/chills Eyes: No visual changes. ENT: No sore throat. Cardiovascular: Denies chest pain. Respiratory: Denies shortness of breath. Gastrointestinal: No abdominal pain.  No nausea, no vomiting.  No diarrhea.  No constipation. Genitourinary: Negative for dysuria. Musculoskeletal: Right thumb pain. Skin: Negative for rash. Neurological: Negative for headaches, focal weakness or numbness.  10-point ROS otherwise negative.  ____________________________________________  PHYSICAL EXAM:  VITAL SIGNS: ED Triage Vitals  Enc Vitals Group     BP --      Pulse --      Resp --      Temp --      Temp src --      SpO2 --      Weight --      Height --      Head Cir --      Peak Flow --      Pain Score --      Pain Loc --      Pain Edu? --      Excl. in GC? --    Constitutional: Alert and oriented. Well appearing and in no acute distress. Eyes: Conjunctivae are normal. PERRL. EOMI. Head: Atraumatic. Nose: No congestion/rhinnorhea. Mouth/Throat: Mucous membranes are moist.  Oropharynx non-erythematous. Neck: No stridor. No deformity free nuchal range of motion nontender palpation. Hematological/Lymphatic/Immunilogical: No cervical  lymphadenopathy. Cardiovascular: Normal rate, regular rhythm. Grossly normal heart sounds.  Good peripheral circulation. Respiratory: Normal respiratory effort.  No retractions. Lungs CTAB. Gastrointestinal: Soft and nontender. No distention. No abdominal bruits. No CVA tenderness. Musculoskeletal: No deformity mild edema neurovascular intact decreased range of motion secondary to complain of pain.  Neurologic:  Normal speech and language. No gross focal neurologic deficits are appreciated. Speech is normal. No gait instability. Skin:  Skin is warm, dry and intact. No rash noted. No abrasion no nail hematoma. Psychiatric: Mood and affect are normal. Speech and behavior are normal.  ____________________________________________   LABS (all labs ordered are listed, but only abnormal results are displayed)  Labs Reviewed - No data to display ____________________________________________  EKG   ____________________________________________  RADIOLOGY  No acute findings x-ray right thumb. ____________________________________________   PROCEDURES  Procedure(s) performed: None  Critical Care performed: No  ____________________________________________   INITIAL IMPRESSION / ASSESSMENT AND PLAN / ED COURSE  Pertinent labs & imaging results that weContusion right thumb.available during my care of the patient were reviewed by me and considered in my medical decision contusion right thumb. ____________________________________________   FINAL CLINICAL IMPRESSION(S) / ED DIAGNOSES  Final diagnoses:  Finger contusion, initial encounter      Joni Reining, PA-C 07/20/14 0454  Sharyn Creamer, MD 07/20/14 [phone]

## 2014-07-20 NOTE — Discharge Instructions (Signed)
Wear splint for2-3 days. °

## 2014-09-09 ENCOUNTER — Telehealth: Payer: Self-pay | Admitting: Family Medicine

## 2014-09-09 NOTE — Telephone Encounter (Signed)
Spoke with Pharmacy and this prescription was sent in error.

## 2014-09-09 NOTE — Telephone Encounter (Signed)
[name] from [facility] is needing clarification. States that in April 2016 a prescription for Prednisone  1 tablet daily was written. In the comment section it states 50/40/30/20/10. She would like to know if this should actually be Prednisone . Please return call [phone]

## 2014-12-10 ENCOUNTER — Emergency Department
Admission: EM | Admit: 2014-12-10 | Discharge: 2014-12-10 | Disposition: A | Discharge status: Home / Self Care | Payer: Medicaid Other | Attending: Emergency Medicine | Admitting: Emergency Medicine

## 2014-12-10 ENCOUNTER — Emergency Department: Payer: Medicaid Other

## 2014-12-10 ENCOUNTER — Encounter: Payer: Self-pay | Admitting: *Deleted

## 2014-12-10 DIAGNOSIS — J209 Acute bronchitis, unspecified: Secondary | ICD-10-CM | POA: Insufficient documentation

## 2014-12-10 DIAGNOSIS — Z79899 Other long term (current) drug therapy: Secondary | ICD-10-CM | POA: Insufficient documentation

## 2014-12-10 DIAGNOSIS — Z880 Allergy status to penicillin: Secondary | ICD-10-CM | POA: Diagnosis not present

## 2014-12-10 DIAGNOSIS — R05 Cough: Secondary | ICD-10-CM | POA: Diagnosis present

## 2014-12-10 DIAGNOSIS — Z720 Tobacco use: Secondary | ICD-10-CM | POA: Diagnosis not present

## 2014-12-10 DIAGNOSIS — Z792 Long term (current) use of antibiotics: Secondary | ICD-10-CM | POA: Insufficient documentation

## 2014-12-10 DIAGNOSIS — J40 Bronchitis, not specified as acute or chronic: Secondary | ICD-10-CM

## 2014-12-10 MED ORDER — ALBUTEROL SULFATE HFA 108 (90 BASE) MCG/ACT IN AERS: 2.0000 | INHALATION_SPRAY | RESPIRATORY_TRACT | Status: DC | PRN

## 2014-12-10 MED ORDER — PREDNISONE 10 MG PO TABS: 10.0000 mg | ORAL_TABLET | ORAL | Status: DC

## 2014-12-10 MED ORDER — AZITHROMYCIN 250 MG PO TABS: ORAL_TABLET | ORAL | Status: DC

## 2014-12-10 NOTE — ED Notes (Signed)
States she has had dry non productive cough for the past 3 days  Chest discomfort with cough and inspiration

## 2014-12-10 NOTE — ED Notes (Signed)
Pt reports a dry cough.   cig smoker.  Sx for 4 days.  Intermittent fevers.  No chest pain.  Pt also report sinus congestion.

## 2014-12-10 NOTE — Discharge Instructions (Signed)
How to Use an Inhaler °Proper inhaler technique is very important. Good technique ensures that the medicine reaches the lungs. Poor technique results in depositing the medicine on the tongue and back of the throat rather than in the airways. If you do not use the inhaler with good technique, the medicine will not help you. °STEPS TO FOLLOW IF USING AN INHALER WITHOUT AN EXTENSION TUBE °1. Remove the cap from the inhaler. °2. If you are using the inhaler for the first time, you will need to prime it. Shake the inhaler for 5 seconds and release four puffs into the air, away from your face. Ask your health care provider or pharmacist if you have questions about priming your inhaler. °3. Shake the inhaler for 5 seconds before each breath in (inhalation). °4. Position the inhaler so that the top of the canister faces up. °5. Put your index finger on the top of the medicine canister. Your thumb supports the bottom of the inhaler. °6. Open your mouth. °7. Either place the inhaler between your teeth and place your lips tightly around the mouthpiece, or hold the inhaler 1-2 inches away from your open mouth. If you are unsure of which technique to use, ask your health care provider. °8. Breathe out (exhale) normally and as completely as possible. °9. Press the canister down with your index finger to release the medicine. °10. At the same time as the canister is pressed, inhale deeply and slowly until your lungs are completely filled. This should take 4-6 seconds. Keep your tongue down. °11. Hold the medicine in your lungs for 5-10 seconds (10 seconds is best). This helps the medicine get into the small airways of your lungs. °12. Breathe out slowly, through pursed lips. Whistling is an example of pursed lips. °13. Wait at least 15-30 seconds between puffs. Continue with the above steps until you have taken the number of puffs your health care provider has ordered. Do not use the inhaler more than your health care provider  tells you. °14. Replace the cap on the inhaler. °15. Follow the directions from your health care provider or the inhaler insert for cleaning the inhaler. °STEPS TO FOLLOW IF USING AN INHALER WITH AN EXTENSION (SPACER) °1. Remove the cap from the inhaler. °2. If you are using the inhaler for the first time, you will need to prime it. Shake the inhaler for 5 seconds and release four puffs into the air, away from your face. Ask your health care provider or pharmacist if you have questions about priming your inhaler. °3. Shake the inhaler for 5 seconds before each breath in (inhalation). °4. Place the open end of the spacer onto the mouthpiece of the inhaler. °5. Position the inhaler so that the top of the canister faces up and the spacer mouthpiece faces you. °6. Put your index finger on the top of the medicine canister. Your thumb supports the bottom of the inhaler and the spacer. °7. Breathe out (exhale) normally and as completely as possible. °8. Immediately after exhaling, place the spacer between your teeth and into your mouth. Close your lips tightly around the spacer. °9. Press the canister down with your index finger to release the medicine. °10. At the same time as the canister is pressed, inhale deeply and slowly until your lungs are completely filled. This should take 4-6 seconds. Keep your tongue down and out of the way. °11. Hold the medicine in your lungs for 5-10 seconds (10 seconds is best). This helps the   medicine get into the small airways of your lungs. Exhale. °12. Repeat inhaling deeply through the spacer mouthpiece. Again hold that breath for up to 10 seconds (10 seconds is best). Exhale slowly. If it is difficult to take this second deep breath through the spacer, breathe normally several times through the spacer. Remove the spacer from your mouth. °13. Wait at least 15-30 seconds between puffs. Continue with the above steps until you have taken the number of puffs your health care provider has  ordered. Do not use the inhaler more than your health care provider tells you. °14. Remove the spacer from the inhaler, and place the cap on the inhaler. °15. Follow the directions from your health care provider or the inhaler insert for cleaning the inhaler and spacer. °If you are using different kinds of inhalers, use your quick relief medicine to open the airways 10-15 minutes before using a steroid if instructed to do so by your health care provider. If you are unsure which inhalers to use and the order of using them, ask your health care provider, nurse, or respiratory therapist. °If you are using a steroid inhaler, always rinse your mouth with water after your last puff, then gargle and spit out the water. Do not swallow the water. °AVOID: °· Inhaling before or after starting the spray of medicine. It takes practice to coordinate your breathing with triggering the spray. °· Inhaling through the nose (rather than the mouth) when triggering the spray. °HOW TO DETERMINE IF YOUR INHALER IS FULL OR NEARLY EMPTY °You cannot know when an inhaler is empty by shaking it. A few inhalers are now being made with dose counters. Ask your health care provider for a prescription that has a dose counter if you feel you need that extra help. If your inhaler does not have a counter, ask your health care provider to help you determine the date you need to refill your inhaler. Write the refill date on a calendar or your inhaler canister. Refill your inhaler 7-10 days before it runs out. Be sure to keep an adequate supply of medicine. This includes making sure it is not expired, and that you have a spare inhaler.  °SEEK MEDICAL CARE IF:  °· Your symptoms are only partially relieved with your inhaler. °· You are having trouble using your inhaler. °· You have some increase in phlegm. °SEEK IMMEDIATE MEDICAL CARE IF:  °· You feel little or no relief with your inhalers. You are still wheezing and are feeling shortness of breath or  tightness in your chest or both. °· You have dizziness, headaches, or a fast heart rate. °· You have chills, fever, or night sweats. °· You have a noticeable increase in phlegm production, or there is blood in the phlegm. °MAKE SURE YOU:  °· Understand these instructions. °· Will watch your condition. °· Will get help right away if you are not doing well or get worse. °  °This information is not intended to replace advice given to you by your health care provider. Make sure you discuss any questions you have with your health care provider. °  °Document Released: 01/28/2000 Document Revised: 11/20/2012 Document Reviewed: 08/29/2012 °Elsevier Interactive Patient Education ©2016 Elsevier Inc. °Acute Bronchitis °Bronchitis is inflammation of the airways that extend from the windpipe into the lungs (bronchi). The inflammation often causes mucus to develop. This leads to a cough, which is the most common symptom of bronchitis.  °In acute bronchitis, the condition usually develops suddenly and goes away   develops suddenly and goes away over time, usually in a couple weeks. Smoking, allergies, and asthma can make bronchitis worse. Repeated episodes of bronchitis may cause further lung problems.  CAUSES  Acute bronchitis is most often caused by the same virus that causes a cold. The virus can spread from person to person (contagious) through coughing, sneezing, and touching contaminated objects.  SIGNS AND SYMPTOMS  Cough.  Fever.  Coughing up mucus.  Body aches.  Chest congestion.  Chills.  Shortness of breath.  Sore throat.  DIAGNOSIS  Acute bronchitis is usually diagnosed through a physical exam. Your health care provider will also ask you questions about your medical history. Tests, such as chest X-rays, are sometimes done to rule out other conditions.  TREATMENT  Acute bronchitis usually goes away in a couple weeks. Oftentimes, no medical treatment is necessary. Medicines are sometimes given for relief of fever or cough.  Antibiotic medicines are usually not needed but may be prescribed in certain situations. In some cases, an inhaler may be recommended to help reduce shortness of breath and control the cough. A cool mist vaporizer may also be used to help thin bronchial secretions and make it easier to clear the chest.  HOME CARE INSTRUCTIONS  Get plenty of rest.  Drink enough fluids to keep your urine clear or pale yellow (unless you have a medical condition that requires fluid restriction). Increasing fluids may help thin your respiratory secretions (sputum) and reduce chest congestion, and it will prevent dehydration.  Take medicines only as directed by your health care provider.  If you were prescribed an antibiotic medicine, finish it all even if you start to feel better.  Avoid smoking and secondhand smoke. Exposure to cigarette smoke or irritating chemicals will make bronchitis worse. If you are a smoker, consider using nicotine gum or skin patches to help control withdrawal symptoms. Quitting smoking will help your lungs heal faster.  Reduce the chances of another bout of acute bronchitis by washing your hands frequently, avoiding people with cold symptoms, and trying not to touch your hands to your mouth, nose, or eyes.  Keep all follow-up visits as directed by your health care provider.  SEEK MEDICAL CARE IF:  Your symptoms do not improve after 1 week of treatment.  SEEK IMMEDIATE MEDICAL CARE IF:  You develop an increased fever or chills.  You have chest pain.  You have severe shortness of breath.  You have bloody sputum.  You develop dehydration.  You faint or repeatedly feel like you are going to pass out.  You develop repeated vomiting.  You develop a severe headache. MAKE SURE YOU:  Understand these instructions.  Will watch your condition.  Will get help right away if you are not doing well or get worse. This information is not intended to replace advice given to you by your health care  provider. Make sure you discuss any questions you have with your health care provider.  Document Released: 03/09/2004 Document Revised: 02/20/2014 Document Reviewed: 07/23/2012  Elsevier Interactive Patient Education [employer]2016 Else[employer] Inc.

## 2014-12-10 NOTE — ED Provider Notes (Signed)
[facility] Emergency Department Provider Note  ____________________________________________  Time seen: Approximately 5:44 PM  I have reviewed the triage vital signs and the nursing notes.   HISTORY  Chief Complaint Cough and Facial Pain    HPI Lynford CitizenHeather N Varnell is a 29 y.o. female who presents to emergency department complaining ofchest tightness, cough, intermittent fevers, and mild nasal congestion. She states that she has been taking Tylenol Cold and flu without any improvement. She states that she has a history of pneumonia and states that the symptoms began to "feel the same as it was before." She was seen at [facility] 7 months ago for her pneumonia. Patient is a current smoker.   No past medical history on file.  There are no active problems to display for this patient.   Past Surgical History  Procedure Laterality Date  . Appendectomy    . Cholecystectomy    . Tubal ligation      Current Outpatient Rx  Name  Route  Sig  Dispense  Refill  . acetaminophen (TYLENOL) 500 MG tablet   Oral   Take 500 mg by mouth every 6 (six) hours as needed for mild pain, moderate pain or headache.         . albuterol (PROVENTIL HFA;VENTOLIN HFA) 108 (90 BASE) MCG/ACT inhaler   Inhalation   Inhale 2 puffs into the lungs every 4 (four) hours as needed for wheezing or shortness of breath.   1 Inhaler   0   . azithromycin (ZITHROMAX Z-PAK) 250 MG tablet      Take two pills on day 1, one pill a day for days 2-5.   6 each   0   . chlorpheniramine-HYDROcodone (TUSSIONEX PENNKINETIC ER) 10-8 MG/5ML LQCR   Oral   Take 5 mLs by mouth every 12 (twelve) hours as needed for cough.   115 mL   0   . ibuprofen (ADVIL,MOTRIN) 600 MG tablet   Oral   Take 1 tablet (600 mg total) by mouth every 6 (six) hours as needed.   30 tablet   0   . mupirocin ointment (BACTROBAN) 2 %      Apply to affected area 3 times daily   22 g   0   . norgestimate-ethinyl  estradiol (ORTHO-CYCLEN,SPRINTEC,PREVIFEM) 0.25-35 MG-MCG tablet   Oral   Take 1 tablet by mouth daily.   1 Package   11   . oxyCODONE-acetaminophen (ROXICET) 5-325 MG per tablet   Oral   Take 1-2 tablets by mouth every 4 (four) hours as needed for severe pain.   15 tablet   0   . predniSONE (DELTASONE) 10 MG tablet   Oral   Take 1 tablet (10 mg total) by mouth as directed.   21 tablet   0     Take on a daily basis of 6, 5, 4, 3, 2, 1   . sulfamethoxazole-trimethoprim (BACTRIM DS,SEPTRA DS) 800-160 MG per tablet   Oral   Take 1 tablet by mouth 2 (two) times daily.   20 tablet   0     Allergies Penicillins and Ultram  No family history on file.  Social History Social History  Substance Use Topics  . Smoking status: Current Every Day Smoker -- 0.75 packs/day for 17 years    Types: Cigarettes  . Smokeless tobacco: None  . Alcohol Use: No    Review of Systems Constitutional: No fever/chills Eyes: No visual changes. ENT: No sore throat. Endorses mild nasal  congestion Cardiovascular: Denies chest pain. Versus chest tightness. Endorses cough. Respiratory: Denies shortness of breath. Gastrointestinal: No abdominal pain.  No nausea, no vomiting.  No diarrhea.  No constipation. Genitourinary: Negative for dysuria. Musculoskeletal: Negative for back pain. Skin: Negative for rash. Neurological: Negative for headaches, focal weakness or numbness.  10-point ROS otherwise negative.  ____________________________________________   PHYSICAL EXAM:  VITAL SIGNS: ED Triage Vitals  Enc Vitals Group     BP 12/10/14 1726 143/99 mmHg     Pulse --      Resp 12/10/14 1726 15     Temp 12/10/14 1726 98.6 F (37 C)     Temp Source 12/10/14 1726 Oral     SpO2 12/10/14 1726 100 %     Weight 12/10/14 1726 155 lb (70.308 kg)     Height 12/10/14 1726  (1.549 m)     Head Cir --      Peak Flow --      Pain Score 12/10/14 1732 7     Pain Loc --      Pain Edu? --       Excl. in GC? --     Constitutional: Alert and oriented. Well appearing and in no acute distress. Eyes: Conjunctivae are normal. PERRL. EOMI. Head: Atraumatic. Nose: No congestion/rhinnorhea. Mouth/Throat: Mucous membranes are moist.  Oropharynx non-erythematous. Neck: No stridor.   Hematological/Lymphatic/Immunilogical: Diffuse anterior cervical lymphadenopathy. Cardiovascular: Normal rate, regular rhythm. Grossly normal heart sounds.  Good peripheral circulation. Respiratory: Normal respiratory effort.  No retractions. Diffuse scattered rhonchi bilateral lung fields. Poor air entry into the bases. Gastrointestinal: Soft and nontender. No distention. No abdominal bruits. No CVA tenderness. Musculoskeletal: No lower extremity tenderness nor edema.  No joint effusions. Neurologic:  Normal speech and language. No gross focal neurologic deficits are appreciated. No gait instability. Skin:  Skin is warm, dry and intact. No rash noted. Psychiatric: Mood and affect are normal. Speech and behavior are normal.  ____________________________________________   LABS (all labs ordered are listed, but only abnormal results are displayed)  Labs Reviewed - No data to display ____________________________________________  EKG  Normal sinus rhythm with some sinus arrhythmia. Inverted T waves in V1. No elevations or depressions noted. No PA-C, PJC, PVC noted. No Q waves or delta waves present. ____________________________________________  RADIOLOGY  Two-view chest x-ray Impression: No active cardiopulmonary disease. ____________________________________________   PROCEDURES  Procedure(s) performed: None  Critical Care performed: No  ____________________________________________   INITIAL IMPRESSION / ASSESSMENT AND PLAN / ED COURSE  Pertinent labs & imaging results that were available during my care of the patient were reviewed by me and considered in my medical decision making (see chart  for details).  Patient's history, symptoms, physical exam, and radiological findings are taken into consideration for diagnosis. Diagnosis most consistent with bronchitis. I'll treat patient with albuterol inhaler, prednisone, azithromycin, and symptom control. Patient verbalizes understanding of diagnosis. Patient verbalizes understanding of treatment plan and verbalizes compliance with same. ____________________________________________   FINAL CLINICAL IMPRESSION(S) / ED DIAGNOSES  Final diagnoses:  Bronchitis      Racheal Patches, PA-C 12/10/14 1939  Loleta Rose, MD 12/10/14 2358

## 2015-01-05 ENCOUNTER — Encounter: Payer: Self-pay | Admitting: Emergency Medicine

## 2015-01-05 ENCOUNTER — Emergency Department
Admission: EM | Admit: 2015-01-05 | Discharge: 2015-01-06 | Disposition: A | Discharge status: Home / Self Care | Payer: Medicaid Other | Attending: Emergency Medicine | Admitting: Emergency Medicine

## 2015-01-05 ENCOUNTER — Emergency Department: Payer: Medicaid Other

## 2015-01-05 DIAGNOSIS — Z7952 Long term (current) use of systemic steroids: Secondary | ICD-10-CM | POA: Diagnosis not present

## 2015-01-05 DIAGNOSIS — R1012 Left upper quadrant pain: Secondary | ICD-10-CM | POA: Insufficient documentation

## 2015-01-05 DIAGNOSIS — R1032 Left lower quadrant pain: Secondary | ICD-10-CM | POA: Insufficient documentation

## 2015-01-05 DIAGNOSIS — Z3202 Encounter for pregnancy test, result negative: Secondary | ICD-10-CM | POA: Diagnosis not present

## 2015-01-05 DIAGNOSIS — F17210 Nicotine dependence, cigarettes, uncomplicated: Secondary | ICD-10-CM | POA: Insufficient documentation

## 2015-01-05 DIAGNOSIS — Z9049 Acquired absence of other specified parts of digestive tract: Secondary | ICD-10-CM | POA: Insufficient documentation

## 2015-01-05 DIAGNOSIS — Z79899 Other long term (current) drug therapy: Secondary | ICD-10-CM | POA: Insufficient documentation

## 2015-01-05 DIAGNOSIS — R21 Rash and other nonspecific skin eruption: Secondary | ICD-10-CM | POA: Diagnosis not present

## 2015-01-05 DIAGNOSIS — R197 Diarrhea, unspecified: Secondary | ICD-10-CM | POA: Diagnosis not present

## 2015-01-05 DIAGNOSIS — R1013 Epigastric pain: Secondary | ICD-10-CM | POA: Insufficient documentation

## 2015-01-05 DIAGNOSIS — Z792 Long term (current) use of antibiotics: Secondary | ICD-10-CM | POA: Insufficient documentation

## 2015-01-05 LAB — CBC WITH DIFFERENTIAL/PLATELET
Basophils Absolute: 0 10*3/uL (ref 0–0.1)
Basophils Relative: 1 %
EOS ABS: 0.2 10*3/uL (ref 0–0.7)
Eosinophils Relative: 2 %
HEMATOCRIT: 36.3 % (ref 35.0–47.0)
HEMOGLOBIN: 11.7 g/dL — AB (ref 12.0–16.0)
LYMPHS ABS: 2.9 10*3/uL (ref 1.0–3.6)
Lymphocytes Relative: 30 %
MCH: 25.9 pg — AB (ref 26.0–34.0)
MCHC: 32.2 g/dL (ref 32.0–36.0)
MCV: 80.5 fL (ref 80.0–100.0)
Monocytes Absolute: 0.6 10*3/uL (ref 0.2–0.9)
Monocytes Relative: 6 %
NEUTROS PCT: 61 %
Neutro Abs: 5.8 10*3/uL (ref 1.4–6.5)
Platelets: 330 10*3/uL (ref 150–440)
RBC: 4.51 MIL/uL (ref 3.80–5.20)
RDW: 16.2 % — ABNORMAL HIGH (ref 11.5–14.5)
WBC: 9.6 10*3/uL (ref 3.6–11.0)

## 2015-01-05 LAB — URINALYSIS COMPLETE WITH MICROSCOPIC (ARMC ONLY)
BILIRUBIN URINE: NEGATIVE
Bacteria, UA: NONE SEEN
GLUCOSE, UA: NEGATIVE mg/dL
Ketones, ur: NEGATIVE mg/dL
Leukocytes, UA: NEGATIVE
Nitrite: NEGATIVE
Protein, ur: NEGATIVE mg/dL
SPECIFIC GRAVITY, URINE: 1.01 (ref 1.005–1.030)
pH: 6 (ref 5.0–8.0)

## 2015-01-05 LAB — COMPREHENSIVE METABOLIC PANEL
ALK PHOS: 58 U/L (ref 38–126)
ALT: 12 U/L — ABNORMAL LOW (ref 14–54)
ANION GAP: 6 (ref 5–15)
AST: 17 U/L (ref 15–41)
Albumin: 3.9 g/dL (ref 3.5–5.0)
BILIRUBIN TOTAL: 0.2 mg/dL — AB (ref 0.3–1.2)
BUN: 7 mg/dL (ref 6–20)
CALCIUM: 8.7 mg/dL — AB (ref 8.9–10.3)
CO2: 25 mmol/L (ref 22–32)
Chloride: 109 mmol/L (ref 101–111)
Creatinine, Ser: 0.59 mg/dL (ref 0.44–1.00)
GFR calc non Af Amer: >60 mL/min (ref >60)
Glucose, Bld: 96 mg/dL (ref 65–99)
Potassium: 3.5 mmol/L (ref 3.5–5.1)
SODIUM: 140 mmol/L (ref 135–145)
TOTAL PROTEIN: 6.6 g/dL (ref 6.5–8.1)

## 2015-01-05 LAB — LIPASE, BLOOD: Lipase: 43 U/L (ref 11–51)

## 2015-01-05 LAB — POCT PREGNANCY, URINE: Preg Test, Ur: NEGATIVE

## 2015-01-05 MED ORDER — IOHEXOL 300 MG/ML  SOLN
100.0000 mL | Freq: Once | INTRAMUSCULAR | Status: AC | PRN
  Administered 2015-01-05: 100 mL via INTRAVENOUS

## 2015-01-05 MED ORDER — METRONIDAZOLE IN NACL 5-0.79 MG/ML-% IV SOLN
500.0000 mg | Freq: Once | INTRAVENOUS | Status: AC
  Administered 2015-01-06: 500 mg via INTRAVENOUS
  Filled 2015-01-05: qty 100

## 2015-01-05 MED ORDER — ONDANSETRON HCL 4 MG/2ML IJ SOLN
4.0000 mg | Freq: Once | INTRAMUSCULAR | Status: AC
  Administered 2015-01-05: 4 mg via INTRAVENOUS
  Filled 2015-01-05: qty 2

## 2015-01-05 MED ORDER — FENTANYL CITRATE (PF) 100 MCG/2ML IJ SOLN
25.0000 ug | Freq: Once | INTRAMUSCULAR | Status: AC
  Administered 2015-01-06: 25 ug via INTRAVENOUS
  Filled 2015-01-05: qty 2

## 2015-01-05 MED ORDER — SODIUM CHLORIDE 0.9 % IV BOLUS (SEPSIS)
1000.0000 mL | Freq: Once | INTRAVENOUS | Status: AC
  Administered 2015-01-05: 1000 mL via INTRAVENOUS

## 2015-01-05 MED ORDER — FENTANYL CITRATE (PF) 100 MCG/2ML IJ SOLN
50.0000 ug | Freq: Once | INTRAMUSCULAR | Status: AC
  Administered 2015-01-05: 50 ug via INTRAVENOUS
  Filled 2015-01-05: qty 2

## 2015-01-05 MED ORDER — IOHEXOL 240 MG/ML SOLN
50.0000 mL | Freq: Once | INTRAMUSCULAR | Status: AC | PRN
  Administered 2015-01-05: 50 mL via ORAL

## 2015-01-05 NOTE — ED Provider Notes (Signed)
CSN: [id number]     Arrival date & time 01/05/15  2023 History   First MD Initiated Contact with Patient 01/05/15 2136     Chief Complaint  Patient presents with  . Diarrhea  . Rash     (Consider location/radiation/quality/duration/timing/severity/associated sxs/prior Treatment) The history is provided by the patient.  Pamela Meyers is a 29 y.o. female hx of appendectomy, cholecystectomy here with diarrhea. Patient states that she has loose stools after the cholecystectomy. Her stepfather was recently diagnosed with C. Difficile. He actually finished several courses of antibiotics. She lives with him And shares a bathroom with him. A last 3 days, she noticed some watery diarrhea about 10 times daily, sometimes greenish in color. Also has some worsening epigastric and left side abdominal pain. Has nausea but no vomiting. She is also having her menstrual cramps right now but this is worse than usual. Denies any fevers. Of note, she noticed some bumps below her nose and was concerned for possible staph infection. Was treated with bactrim for it previously. Denies purulent drainage from the rash.    History reviewed. No pertinent past medical history. Past Surgical History  Procedure Laterality Date  . Appendectomy    . Cholecystectomy    . Tubal ligation     History reviewed. No pertinent family history. Social History  Substance Use Topics  . Smoking status: Current Every Day Smoker -- 0.75 packs/day for 17 years    Types: Cigarettes  . Smokeless tobacco: None  . Alcohol Use: No   OB History    Gravida Para Term Preterm AB TAB SAB Ectopic Multiple Living   3 3 3       2      Review of Systems  Gastrointestinal: Positive for abdominal pain and diarrhea.  Skin: Positive for rash.  All other systems reviewed and are negative.     Allergies  Penicillins; Ultram; and Morphine and related  Home Medications   Prior to Admission medications   Medication Sig Start Date End Date  Taking? Authorizing Provider  acetaminophen (TYLENOL) 500 MG tablet Take 500 mg by mouth every 6 (six) hours as needed for mild pain, moderate pain or headache.    Historical Provider, MD  albuterol (PROVENTIL HFA;VENTOLIN HFA) 108 (90 BASE) MCG/ACT inhaler Inhale 2 puffs into the lungs every 4 (four) hours as needed for wheezing or shortness of breath. 12/10/14   Delorise RoyalsJonathan D Cuthriell, PA-C  azithromycin (ZITHROMAX Z-PAK) 250 MG tablet Take two pills on day 1, one pill a day for days 2-5. 12/10/14   Delorise RoyalsJonathan D Cuthriell, PA-C  chlorpheniramine-HYDROcodone (TUSSIONEX PENNKINETIC ER) 10-8 MG/5ML LQCR Take 5 mLs by mouth every 12 (twelve) hours as needed for cough. 05/15/14   Elwin MochaBlair Walden, MD  ibuprofen (ADVIL,MOTRIN) 600 MG tablet Take 1 tablet (600 mg total) by mouth every 6 (six) hours as needed. 09/08/13   Wilmer FloorLisa A Leftwich-Kirby, CNM  mupirocin ointment (BACTROBAN) 2 % Apply to affected area 3 times daily 06/22/14 06/22/15  Evangeline Dakinharles M Beers, PA-C  norgestimate-ethinyl estradiol (ORTHO-CYCLEN,SPRINTEC,PREVIFEM) 0.25-35 MG-MCG tablet Take 1 tablet by mouth daily. 09/08/13   Wilmer FloorLisa A Leftwich-Kirby, CNM  oxyCODONE-acetaminophen (ROXICET) 5-325 MG per tablet Take 1-2 tablets by mouth every 4 (four) hours as needed for severe pain. 06/22/14   Charmayne Sheerharles M Beers, PA-C  predniSONE (DELTASONE) 10 MG tablet Take 1 tablet (10 mg total) by mouth as directed. 12/10/14   Delorise RoyalsJonathan D Cuthriell, PA-C  sulfamethoxazole-trimethoprim (BACTRIM DS,SEPTRA DS) 800-160 MG per tablet Take 1 tablet  by mouth 2 (two) times daily. 06/22/14   Charmayne Sheer Beers, PA-C   BP 127/88 mmHg  Pulse 84  Temp(Src) 98.6 F (37 C) (Oral)  Resp 16  Ht  (1.549 m)  Wt 145 lb (65.772 kg)  BMI 27.41 kg/m2  SpO2 99%  LMP 12/09/2014 Physical Exam  Constitutional: She is oriented to person, place, and time. She appears well-developed and well-nourished.  HENT:  Head: Normocephalic.  MM slightly dry. Pimples below the nose, no obvious cellulitis   Eyes:  Conjunctivae are normal. Pupils are equal, round, and reactive to light.  Neck: Normal range of motion. Neck supple.  Cardiovascular: Normal rate, regular rhythm and normal heart sounds.   Pulmonary/Chest: Effort normal and breath sounds normal. No respiratory distress. She has no wheezes. She has no rales.  Abdominal: Soft. Bowel sounds are normal.  Mild diffuse tenderness, worse on LLQ and LUQ   Musculoskeletal: Normal range of motion. She exhibits no edema or tenderness.  Neurological: She is alert and oriented to person, place, and time.  Skin: Skin is warm and dry.  Psychiatric: She has a normal mood and affect. Her behavior is normal. Judgment and thought content normal.  Nursing note and vitals reviewed.   ED Course  Procedures (including critical care time) Labs Review Labs Reviewed  CBC WITH DIFFERENTIAL/PLATELET - Abnormal; Notable for the following:    Hemoglobin 11.7 (*)    MCH 25.9 (*)    RDW 16.2 (*)    All other components within normal limits  COMPREHENSIVE METABOLIC PANEL - Abnormal; Notable for the following:    Calcium 8.7 (*)    ALT 12 (*)    Total Bilirubin 0.2 (*)    All other components within normal limits  URINALYSIS COMPLETEWITH MICROSCOPIC ([facility] ONLY) - Abnormal; Notable for the following:    Color, Urine YELLOW (*)    APPearance CLEAR (*)    Hgb urine dipstick 1+ (*)    Squamous Epithelial / LPF 0-5 (*)    All other components within normal limits  C DIFFICILE QUICK SCREEN W PCR REFLEX  LIPASE, BLOOD  POCT PREGNANCY, URINE  POC URINE PREG, ED    Imaging Review No results found. I have personally reviewed and evaluated these images and lab results as part of my medical decision-making.   EKG Interpretation None      MDM   Final diagnoses:  Diarrhea, unspecified type    Pamela Meyers is a 29 y.o. female here with diarrhea, nausea. Likely C diff since family has C diff currently. Will get labs, UA, CT ab/pel, stool for C diff. Will  hydrate and reassess.   11:12 PM Signed out to Dr. Fanny Bien. WBC nl. Cr nl. CT ab/pel and stool for C diff pending. If CT unremarkable, can be discharged. Consider empirically treating for C diff given contact. Dr. Fanny Bien to reassess patient.     Richardean Canal, MD 01/05/15 [phone]

## 2015-01-05 NOTE — ED Notes (Signed)
Patient drinking second bottle of contrast. Patient notified of need of stool sample. Patient verbalizes understanding and states she will try after drinking contrast.

## 2015-01-05 NOTE — ED Notes (Signed)
Pt states her father has c-diff and she is living in the same home. Has developed similar symptoms today with abd pain and diarrhea that looks like "green algae"

## 2015-01-05 NOTE — ED Notes (Signed)
Pt arrived to the ED for complaints of diarrhea and rash on her face. Pt states that she shares a bathroom with a person that is C-diff positive and now she is experiencing diarrhea. Pt is also complaining of a rash on her face; and states that she has had staff infections previously. Pt is AOx4 in no apparent distress.

## 2015-01-06 MED ORDER — ONDANSETRON 4 MG PO TBDP: 4.0000 mg | ORAL_TABLET | Freq: Four times a day (QID) | ORAL | Status: DC | PRN

## 2015-01-06 MED ORDER — OXYCODONE-ACETAMINOPHEN 5-325 MG PO TABS
1.0000 | ORAL_TABLET | ORAL | Status: AC
  Administered 2015-01-06: 1 via ORAL
  Filled 2015-01-06: qty 1

## 2015-01-06 MED ORDER — CIPROFLOXACIN HCL 500 MG PO TABS: 500.0000 mg | ORAL_TABLET | Freq: Two times a day (BID) | ORAL | Status: DC

## 2015-01-06 MED ORDER — CIPROFLOXACIN HCL 500 MG PO TABS
500.0000 mg | ORAL_TABLET | Freq: Once | ORAL | Status: AC
  Administered 2015-01-06: 500 mg via ORAL
  Filled 2015-01-06: qty 1

## 2015-01-06 MED ORDER — METRONIDAZOLE 500 MG PO TABS: 500.0000 mg | ORAL_TABLET | Freq: Three times a day (TID) | ORAL | Status: DC

## 2015-01-06 MED ORDER — OXYCODONE-ACETAMINOPHEN 5-325 MG PO TABS: 1.0000 | ORAL_TABLET | Freq: Four times a day (QID) | ORAL | Status: DC | PRN

## 2015-01-06 NOTE — ED Provider Notes (Signed)
CT Abdomen Pelvis W Contrast   Status: Final result       PACS Images     Show images for CT Abdomen Pelvis W Contrast     Study Result     CLINICAL DATA: Abdominal pain. Diarrhea for 3 days. Step father recently diagnosed with Clostridium difficile.  EXAM: CT ABDOMEN AND PELVIS WITH CONTRAST  TECHNIQUE: Multidetector CT imaging of the abdomen and pelvis was performed using the standard protocol following bolus administration of intravenous contrast.  CONTRAST: OMNIPAQUE IOHEXOL 300 MG/ML SOLN  COMPARISON: 04/23/2013  FINDINGS: Lung bases are clear.  Surgical absence of the gallbladder. No bile duct dilatation. Liver, spleen, pancreas, adrenal glands, kidneys, abdominal aorta, inferior vena cava, and retroperitoneal lymph nodes are unremarkable. Stomach, small bowel, and colon are not abnormally distended. Stool fills the colon. There is focal wall thickening in the transverse colon which could indicate colitis. Remainder of the colonic wall does not appear thickened. No free fluid or free air in the abdomen. Abdominal wall musculature appears intact.  Pelvis: Surgical absence of the appendix. Uterus and ovaries are not enlarged. No free or loculated pelvic fluid collections. No pelvic mass or lymphadenopathy. Bladder wall is not thickened. No destructive bone lesions.  IMPRESSION: Mild thickening of the colon wall in the transverse region suggest focal colitis.   Electronically Signed  By: Burman Nieves M.D.  On: 01/05/2015 23:59           Vitals     Height Weight BMI (Calculated)     (1.549 m) 145 lb (65.772 kg) 27.5      Interpretation Summary     CLINICAL DATA: Abdominal pain. Diarrhea for 3 days. Step father recently diagnosed with Clostridium difficile.  EXAM: CT ABDOMEN AND PELVIS WITH CONTRAST  TECHNIQUE: Multidetector CT imaging of the abdomen and pelvis was performed using the standard  protocol following bolus administration of intravenous contrast.  CONTRAST: OMNIPAQUE IOHEXOL 300 MG/ML SOLN  COMPARISON: 04/23/2013  FINDINGS: Lung bases are clear.  Surgical absence of the gallbladder. No bile duct dilatation. Liver, spleen, pancreas, adrenal glands, kidneys, abdominal aorta, inferior vena cava, and retroperitoneal lymph nodes are unremarkable. Stomach, small bowel, and colon are not abnormally distended. Stool fills the colon. There is focal wall thickening in the transverse colon which could indicate colitis. Remainder of the colonic wall does not appear thickened. No free fluid or free air in the abdomen. Abdominal wall musculature appears intact.  Pelvis: Surgical absence of the appendix. Uterus and ovaries are not enlarged. No free or loculated pelvic fluid collections. No pelvic mass or lymphadenopathy. Bladder wall is not thickened. No destructive bone lesions.  IMPRESSION: Mild thickening of the colon wall in the transverse region suggest focal colitis.   Electronically Signed  By: Burman Nieves M.D.  On: 01/05/2015 23:59      External Result Report     External Result Report     Imaging     Imaging Information     Signed by     Signed Date/Time   Phone Pager    Burman Nieves 01/05/2015 11:59 PM [phone]       Exam Information     Status Exam Begun   Exam Ended      Final [99] 01/05/2015 11:45 PM 01/05/2015 11:53 PM      Signed     Electronically signed by Burman Nieves, MD on 01/05/15 at 2359 EST     Imaging Related Medications     Medication  iohexol (OMNIPAQUE) 300 MG/ML solution 100 mL    Route: Intravenous    Admin Amount: 100 mL    Volume: 100 mL    PRN Reason(s): contrast    Last Admin Time: 01/05/15 2346    Number of Expected Doses: 1      Most Recent Administration:    User Action Time Recorded Time Dose Route Site Comment  Action Reason    Alvira MondayMegan S Young, Rad Tech 01/05/15 2346 01/05/15 2346 100 mL Intravenous   Contrast Given     Full Administration Report                  Original Order     Ordered On Ordered By      01/05/2015 9:45 PM Richardean Canalavid H Yao, MD             PACS Images     Show images for CT Abdomen Pelvis W Contrast    CT Abdomen Pelvis W Contrast (Order [id number])  Imaging  Order: [id number]   Date: 01/05/2015  Department: [facility] EMERGENCY DEPARTMENT  Released By/Authorizing: Richardean Canalavid H Yao, MD (auto-released)       Order Information     Order Date/Time Release Date/Time Start Date/Time End Date/Time    01/05/15 09:45 PM 01/05/15 09:45 PM 01/05/15 09:46 PM 01/05/15 09:46 PM      Order Details     Frequency Duration Priority Order Class    1 time imaging 1 occurrence STAT Hospital Performed      Order Questions     Question Answer Comment    Reason for Exam (SYMPTOM OR DIAGNOSIS REQUIRED) abdominal pain. possible colitis     Note:  Appropriate reason for exam on pre-op X-rays should indicate the reason for the surgery. Examples include: Coronary Artery disease, osteoartiritis of the knee, brain tumor, etc.    Does the patient have a contrast media/X-ray dye allergy? No     Note:  If yes, STOP and contact a CT Technologist, [facility]      Order History  Inpatient    Date/Time Action Taken User Additional Information    0000 Result Renee PainMegan S Young, Rad Tech In process    01/05/15 2145 Release Richardean Canalavid H Yao, MD (auto-released) [phone]:[id number]    01/05/15 2359 Result Rad Results In Interface Final      Imaging CC Recipients       Collection Information     Resulting Agency: [facility]       Order Provider Info       Office phone Pager/beeper E-mail    Ordering User Richardean Canalavid H Yao, MD [phone] -- --    Authorizing Provider Richardean Canalavid H Yao, MD [phone] -- --     Attending Provider Sharyn CreamerMark Makenzee Choudhry, MD [phone] -- --    Billing Provider Burman NievesWilliam Stevens, MD [phone] -- --      Reprint Requisition     CT Abdomen Pelvis W Contrast (Order #[id number]) on 01/05/15     Original Order     Ordered On Ordered By      01/05/2015 9:45 PM Richardean Canalavid H Yao, MD             ----------------------------------------- 1:32 AM on 01/06/2015 -----------------------------------------  Patient reports mild achy pain, she has not been able to stool here in the ER yet. CT does reveal colitis. We did discuss careful return precautions and treatment recommendations. We will provide her with prescriptions for Flagyl, Cipro, Zofran, and Percocet. She has taken  Percocet safely in the past and has no allergy to it.  Patient's husband is picking her up. Careful return precautions advised.  I will prescribe the patient a narcotic pain medicine due to their condition which I anticipate will cause at least moderate pain short term. I discussed with the patient safe use of narcotic pain medicines, and that they are not to drive, work in dangerous areas, or ever take more than prescribed (no more than 1 pill every 6 hours). We discussed that this is the type of medication that "Criss Alvine" may have overdosed on and the risks of this type of medicine. Patient is very agreeable to only use as prescribed and to never use more than prescribed.     Sharyn Creamer, MD 01/06/15 [phone]

## 2015-01-06 NOTE — ED Notes (Signed)
MD at bedside. 

## 2015-03-12 IMAGING — CR DG ABDOMEN 2V
1 series · 2 of 2 positions shown · non-contrast
Comparison: None.

CLINICAL DATA: Upper abdominal pain with nausea and vomiting.

EXAM:
ABDOMEN - 2 VIEW

[Series 1: w abdomen upright · 0.14mm/px · 2 of 2 slices shown]
[im 1/2]
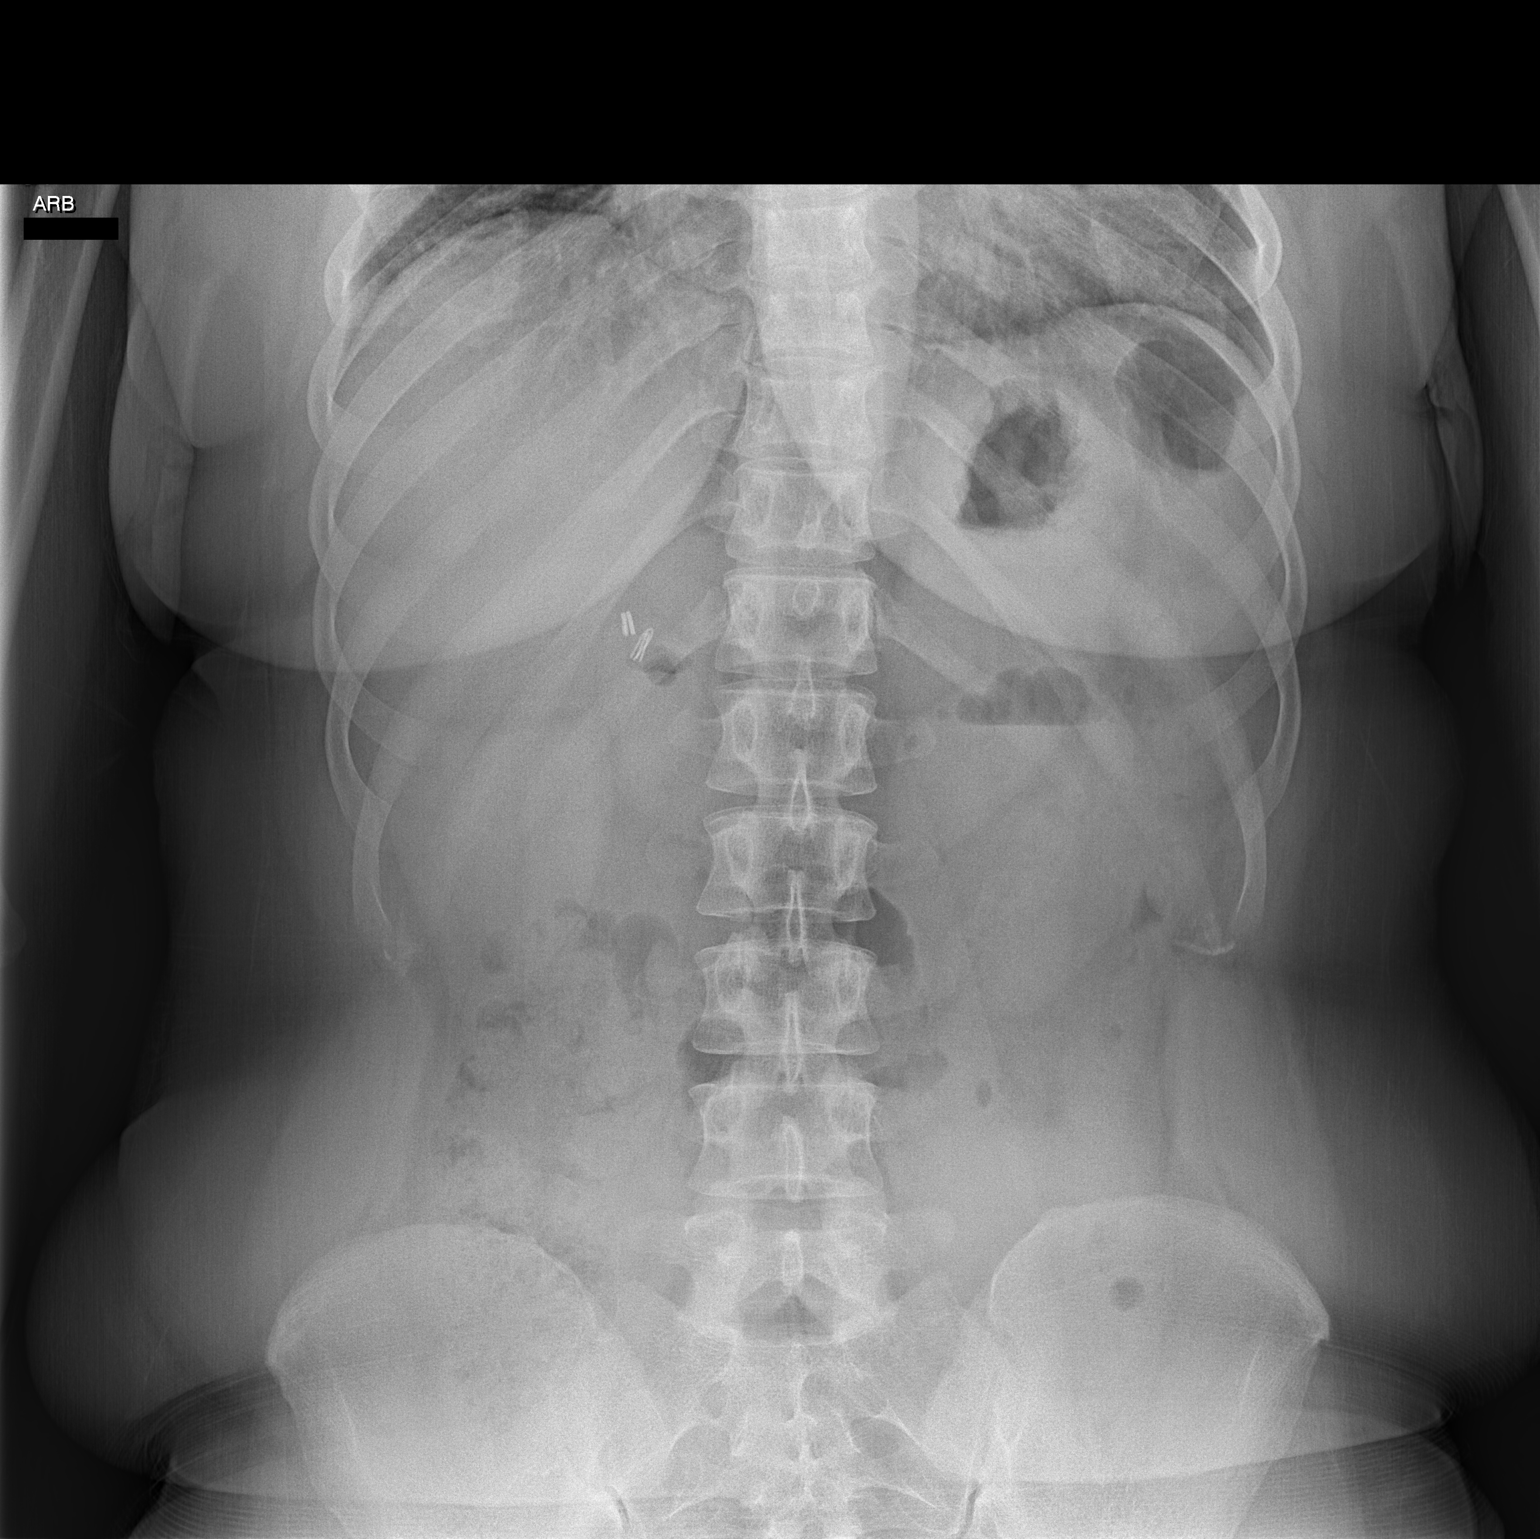
[im 2/2]
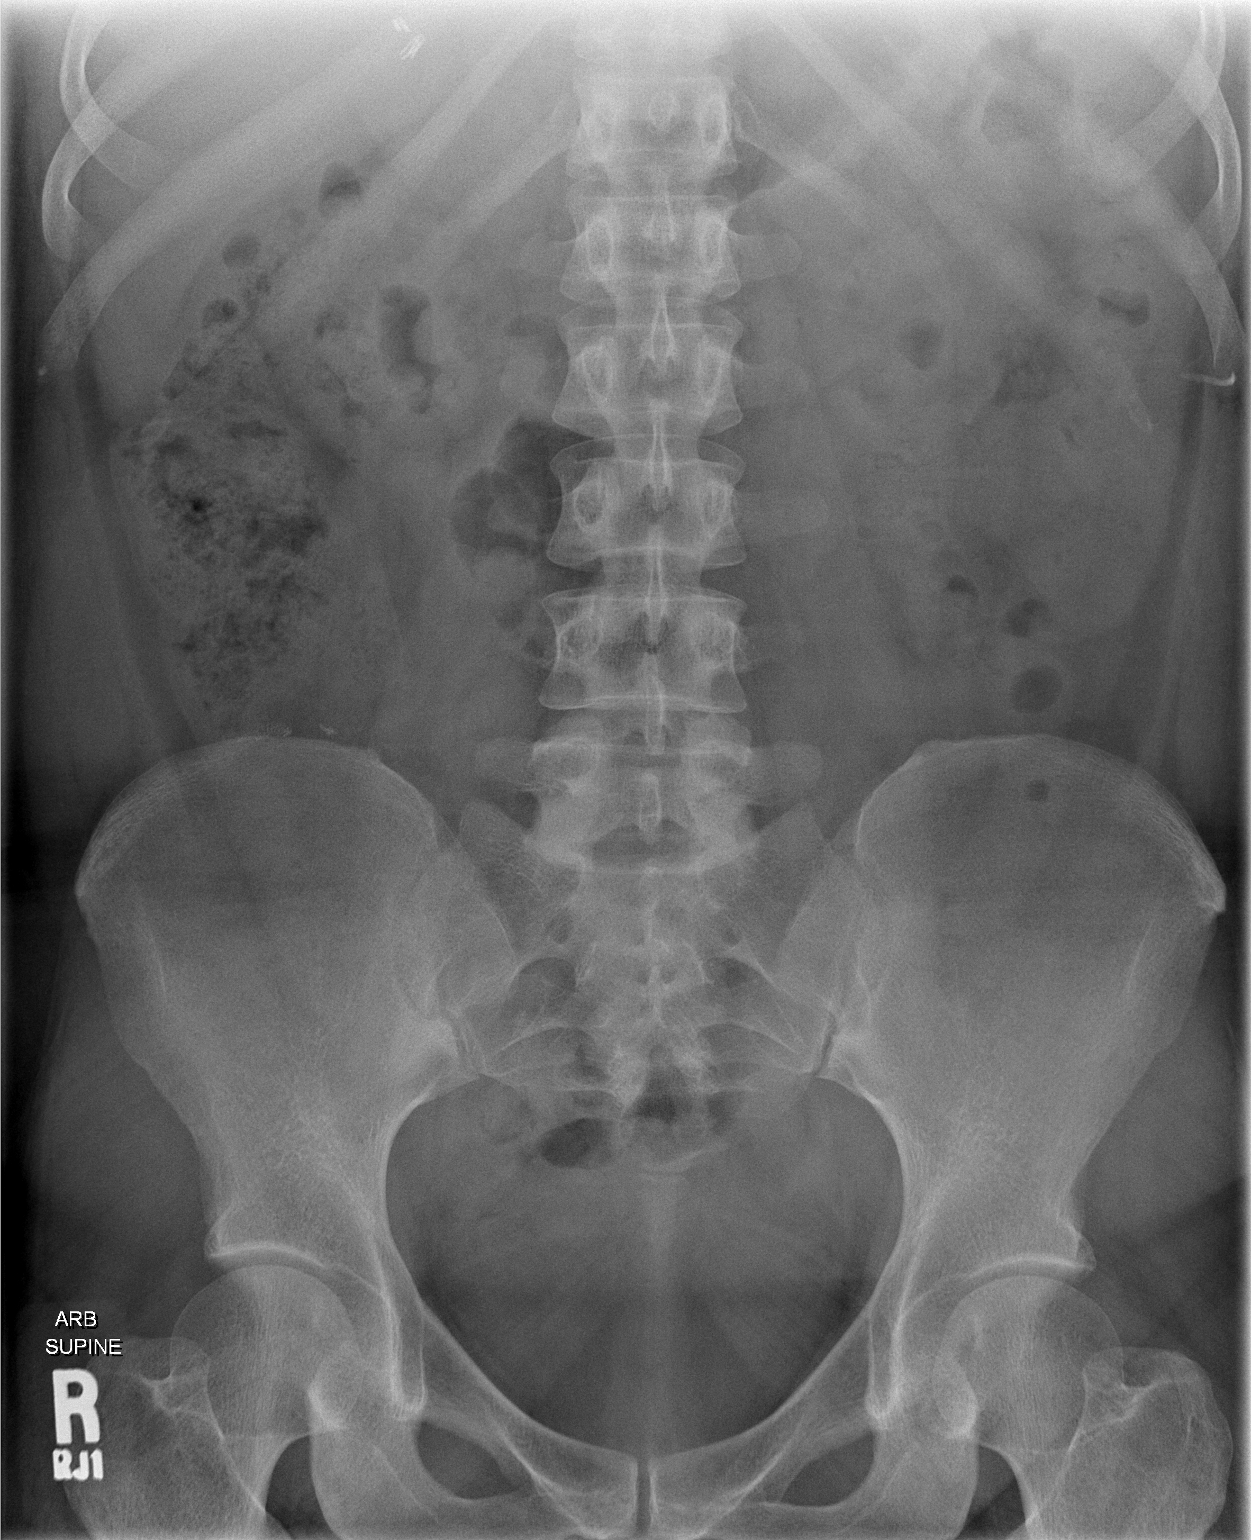

[2 of 2 positions shown; findings below may reference images not displayed]

FINDINGS: Bowel gas pattern is nonobstructive with mild fecal retention over
the right colon. There is no free peritoneal air. There are no
dilated bowel loops. Surgical clips are present over the right upper
quadrant. Few nonspecific air-fluid levels over the left upper
quadrant. There is a surgical suture line over the right lower
quadrant. Remainder of the exam is unremarkable.
IMPRESSION: Nonobstructive bowel gas pattern with minimal fecal retention over
the right colon.

## 2015-03-18 ENCOUNTER — Emergency Department: Payer: Medicaid Other

## 2015-03-18 ENCOUNTER — Emergency Department
Admission: EM | Admit: 2015-03-18 | Discharge: 2015-03-18 | Disposition: A | Discharge status: Home / Self Care | Payer: Medicaid Other | Attending: Student | Admitting: Student

## 2015-03-18 DIAGNOSIS — Y93E2 Activity, laundry: Secondary | ICD-10-CM | POA: Diagnosis not present

## 2015-03-18 DIAGNOSIS — Z793 Long term (current) use of hormonal contraceptives: Secondary | ICD-10-CM | POA: Diagnosis not present

## 2015-03-18 DIAGNOSIS — Z792 Long term (current) use of antibiotics: Secondary | ICD-10-CM | POA: Diagnosis not present

## 2015-03-18 DIAGNOSIS — S59911A Unspecified injury of right forearm, initial encounter: Secondary | ICD-10-CM | POA: Diagnosis present

## 2015-03-18 DIAGNOSIS — Z880 Allergy status to penicillin: Secondary | ICD-10-CM | POA: Diagnosis not present

## 2015-03-18 DIAGNOSIS — Z79899 Other long term (current) drug therapy: Secondary | ICD-10-CM | POA: Diagnosis not present

## 2015-03-18 DIAGNOSIS — W01190A Fall on same level from slipping, tripping and stumbling with subsequent striking against furniture, initial encounter: Secondary | ICD-10-CM | POA: Diagnosis not present

## 2015-03-18 DIAGNOSIS — S5011XA Contusion of right forearm, initial encounter: Secondary | ICD-10-CM | POA: Insufficient documentation

## 2015-03-18 DIAGNOSIS — F17210 Nicotine dependence, cigarettes, uncomplicated: Secondary | ICD-10-CM | POA: Insufficient documentation

## 2015-03-18 DIAGNOSIS — Y998 Other external cause status: Secondary | ICD-10-CM | POA: Diagnosis not present

## 2015-03-18 DIAGNOSIS — Y9289 Other specified places as the place of occurrence of the external cause: Secondary | ICD-10-CM | POA: Insufficient documentation

## 2015-03-18 MED ORDER — MELOXICAM 15 MG PO TABS: 15.0000 mg | ORAL_TABLET | Freq: Every day | ORAL | Status: DC

## 2015-03-18 MED ORDER — HYDROCODONE-ACETAMINOPHEN 5-325 MG PO TABS: 1.0000 | ORAL_TABLET | ORAL | Status: DC | PRN

## 2015-03-18 MED ORDER — HYDROCODONE-ACETAMINOPHEN 5-325 MG PO TABS
1.0000 | ORAL_TABLET | Freq: Once | ORAL | Status: AC
  Administered 2015-03-18: 1 via ORAL
  Filled 2015-03-18: qty 1

## 2015-03-18 NOTE — ED Provider Notes (Signed)
[facility] Emergency Department Provider Note  ____________________________________________  Time seen: Approximately 8:56 PM  I have reviewed the triage vital signs and the nursing notes.   HISTORY  Chief Complaint Arm Pain    HPI Pamela Meyers is a 30 y.o. female who presents emergency department complaining of right forearm pain. Patient was in her laundry room when she tripped over her dog's leash falling trying to catch herself with her right forearm. She states that she hit the edge of the door with an outstretched arm. She notes bruising and swelling to area. She has pain radiating from site down to her wrist. She denies any numbness or tingling at this time and her upper extremity. Patient denies any pains of the elbow, shoulder, she did not hit her head or lose consciousness. Patient states the pain is constant, sharp, moderate to severe.   No past medical history on file.  There are no active problems to display for this patient.   Past Surgical History  Procedure Laterality Date  . Appendectomy    . Cholecystectomy    . Tubal ligation      Current Outpatient Rx  Name  Route  Sig  Dispense  Refill  . acetaminophen (TYLENOL) 500 MG tablet   Oral   Take 500 mg by mouth every 6 (six) hours as needed for mild pain, moderate pain or headache.         . albuterol (PROVENTIL HFA;VENTOLIN HFA) 108 (90 BASE) MCG/ACT inhaler   Inhalation   Inhale 2 puffs into the lungs every 4 (four) hours as needed for wheezing or shortness of breath.   1 Inhaler   0   . azithromycin (ZITHROMAX Z-PAK) 250 MG tablet      Take two pills on day 1, one pill a day for days 2-5.   6 each   0   . chlorpheniramine-HYDROcodone (TUSSIONEX PENNKINETIC ER) 10-8 MG/5ML LQCR   Oral   Take 5 mLs by mouth every 12 (twelve) hours as needed for cough.   115 mL   0   . ciprofloxacin (CIPRO) 500 MG tablet   Oral   Take 1 tablet (500 mg total) by mouth 2 (two) times  daily.   20 tablet   0   . HYDROcodone-acetaminophen (NORCO/VICODIN) 5-325 MG tablet   Oral   Take 1 tablet by mouth every 4 (four) hours as needed for moderate pain.   10 tablet   0   . ibuprofen (ADVIL,MOTRIN) 600 MG tablet   Oral   Take 1 tablet (600 mg total) by mouth every 6 (six) hours as needed.   30 tablet   0   . meloxicam (MOBIC) 15 MG tablet   Oral   Take 1 tablet (15 mg total) by mouth daily.   30 tablet   0   . metroNIDAZOLE (FLAGYL) 500 MG tablet   Oral   Take 1 tablet (500 mg total) by mouth 3 (three) times daily.   30 tablet   0   . mupirocin ointment (BACTROBAN) 2 %      Apply to affected area 3 times daily   22 g   0   . norgestimate-ethinyl estradiol (ORTHO-CYCLEN,SPRINTEC,PREVIFEM) 0.25-35 MG-MCG tablet   Oral   Take 1 tablet by mouth daily.   1 Package   11   . ondansetron (ZOFRAN ODT) 4 MG disintegrating tablet   Oral   Take 1 tablet (4 mg total) by mouth every 6 (six) hours  as needed for nausea or vomiting.   20 tablet   0   . oxyCODONE-acetaminophen (ROXICET) 5-325 MG tablet   Oral   Take 1 tablet by mouth every 6 (six) hours as needed for severe pain.   10 tablet   0   . predniSONE (DELTASONE) 10 MG tablet   Oral   Take 1 tablet (10 mg total) by mouth as directed.   21 tablet   0     Take on a daily basis of 6, 5, 4, 3, 2, 1   . sulfamethoxazole-trimethoprim (BACTRIM DS,SEPTRA DS) 800-160 MG per tablet   Oral   Take 1 tablet by mouth 2 (two) times daily.   20 tablet   0     Allergies Penicillins; Ultram; and Morphine and related  No family history on file.  Social History Social History  Substance Use Topics  . Smoking status: Current Every Day Smoker -- 0.75 packs/day for 17 years    Types: Cigarettes  . Smokeless tobacco: Not on file  . Alcohol Use: No     Review of Systems  Constitutional: No fever/chills Eyes: No visual changes.  Cardiovascular: no chest pain. Respiratory: no cough. No  SOB. Genitourinary: Negative for dysuria. No hematuria Musculoskeletal: Negative for back pain. Positive right arm pain. Skin: Negative for rash. Neurological: Negative for headaches, focal weakness or numbness. 10-point ROS otherwise negative.  ____________________________________________   PHYSICAL EXAM:  VITAL SIGNS: ED Triage Vitals  Enc Vitals Group     BP 03/18/15 2025 143/89 mmHg     Pulse Rate 03/18/15 2025 96     Resp 03/18/15 2025 18     Temp 03/18/15 2025 98.7 F (37.1 C)     Temp Source 03/18/15 2025 Oral     SpO2 03/18/15 2025 100 %     Weight 03/18/15 2025 140 lb (63.504 kg)     Height 03/18/15 2025 5' (1.524 m)     Head Cir --      Peak Flow --      Pain Score 03/18/15 2026 9     Pain Loc --      Pain Edu? --      Excl. in GC? --      Constitutional: Alert and oriented. Well appearing and in no acute distress. Eyes: Conjunctivae are normal. PERRL. EOMI. Head: Atraumatic. Neck: No stridor.  No cervical spine tenderness to palpation. Cardiovascular: Normal rate, regular rhythm. Normal S1 and S2.  Good peripheral circulation. Respiratory: Normal respiratory effort without tachypnea or retractions. Lungs CTAB. Musculoskeletal: No lower extremity tenderness nor edema.  No joint effusions. Visible ecchymosis and edema noted to the medial right forearm when compared with left. Patient is very tender to palpation over the midshaft ulna. No palpable abnormality. Full range of motion to elbow and wrist. Radial pulse intact. Sensation intact 5 digits. Neurologic:  Normal speech and language. No gross focal neurologic deficits are appreciated.  Skin:  Skin is warm, dry and intact. No rash noted. Psychiatric: Mood and affect are normal. Speech and behavior are normal. Patient exhibits appropriate insight and judgement.   ____________________________________________   LABS (all labs ordered are listed, but only abnormal results are displayed)  Labs Reviewed - No  data to display ____________________________________________  EKG   ____________________________________________  RADIOLOGY Festus Barren Cuthriell, personally viewed and evaluated these images (plain radiographs) as part of my medical decision making, as well as reviewing the written report by the radiologist.  Dg Forearm Right  03/18/2015  CLINICAL DATA:  Pt states that she tripped over dog's leash in laundry room 1 hours ago, attempted to brace for fall with her rt forearm hitting the edge of the door, pt has bruising, swelling, and pain to the forearm area with decreased sensation to her finger tips but able to move fingers. EXAM: RIGHT FOREARM - 2 VIEW COMPARISON:  None. FINDINGS: No fracture. Wrist and elbow joints are normally spaced and aligned. There is dorsal soft tissue edema. IMPRESSION: No fracture or dislocation. Electronically Signed   By: Amie Portland M.D.   On: 03/18/2015 20:48    ____________________________________________    PROCEDURES  Procedure(s) performed:       Medications  HYDROcodone-acetaminophen (NORCO/VICODIN) 5-325 MG per tablet 1 tablet (not administered)     ____________________________________________   INITIAL IMPRESSION / ASSESSMENT AND PLAN / ED COURSE  Pertinent labs & imaging results that were available during my care of the patient were reviewed by me and considered in my medical decision making (see chart for details).  Patient's diagnosis is consistent with forearm contusion. Patient will be discharged home with prescriptions for anti-inflammatories and pain medication. Patient is to follow up with orthopedics if symptoms persist past this treatment course. Patient is given ED precautions to return to the ED for any worsening or new symptoms.     ____________________________________________  FINAL CLINICAL IMPRESSION(S) / ED DIAGNOSES  Final diagnoses:  Forearm contusion, right, initial encounter      NEW MEDICATIONS  STARTED DURING THIS VISIT:  New Prescriptions   HYDROCODONE-ACETAMINOPHEN (NORCO/VICODIN) 5-325 MG TABLET    Take 1 tablet by mouth every 4 (four) hours as needed for moderate pain.   MELOXICAM (MOBIC) 15 MG TABLET    Take 1 tablet (15 mg total) by mouth daily.       Delorise Royals Cuthriell, PA-C 03/18/15 2122  Gayla Doss, MD 03/19/15 [phone]

## 2015-03-18 NOTE — Discharge Instructions (Signed)
Periosteal Hematoma Periosteal hematoma (bone bruise) is a localized, tender, raised area close to the bone. It can occur from a small hidden fracture of the bone, following surgery, or from other trauma to the area. It typically occurs in bones located close to the surface of the skin, such as the shin, knee, and heel bone. Although it may take 2 or more weeks to completely heal, bone bruises typically are not associated with permanent or serious damage to the bone. If you are taking blood thinners, you may be at greater risk for such injuries.  CAUSES  A bone bruise is usually caused by high-impact trauma to the bone, but it can be caused by sports injuries or twisting injuries. SIGNS AND SYMPTOMS   Severe pain around the injured area that typically lasts longer than a normal bruise.  Difficulty using the bruised area.  Tender, raised area close to the bone.  Discoloration or swelling of the bruised area. DIAGNOSIS  You may need an MRI of the injured area to confirm a bone bruise if your health care provider feels it is necessary. A regular X-ray will not detect a bone bruise, but it will detect a broken bone (fracture). An X-ray may be taken to rule out any fractures. TREATMENT  Often, the best treatment for a bone bruise is resting, icing, and applying cold compresses to the injured area. Over-the-counter medicines may also be recommended for pain control. HOME CARE INSTRUCTIONS  Some things you can do to improve the condition are:   Rest and elevate the area of injury as long as it is very tender or swollen.  Apply ice to the injured area:  Put ice in a plastic bag.  Place a towel between your skin and the bag.  Leave the ice on for 20 minutes, 2-3 times a day.  Use an elastic wrap to reduce swelling and protect the injured area. Make sure it is not applied too tightly. If the area around the wrap becomes cold or blue, the wrap is too tight. Wrap it more loosely.  For  activity:  Follow your health care provider's instructions about whether walking with crutches is required. This will depend on how serious your condition is.  Start weight bearing gradually on the bruised part.  Continue to use crutches or a cane until you can stand without causing pain, or as instructed.  If a plaster splint was applied:  Wear the splint until you are seen for a follow-up exam.  Rest it on nothing harder than a pillow the first 24 hours.  Do not put weight on it.  Do not get it wet. You may take it off to take a shower or bath.  You may have been given an elastic bandage to use with or without the plaster splint. The splint is too tight if you have numbness or tingling, or if the skin around the bandage becomes cold and blue. Adjust the bandage to make it comfortable.  If an air splint was applied:  You may alter the amount of air in the splint as needed for comfort.  You may take it off at night and to take a shower or bath.  If the injury was in either leg, wiggle your toes in the splint several times per day if you are able.  Only take over-the-counter or prescription medicines for pain, discomfort, or fever as directed by your health care provider.  Keep all follow-up visits with your health care provider. This includes   any orthopedic referrals, physical therapy, and rehabilitation. Any delay in getting necessary care could result in a delay or failure of the bones to heal. SEEK MEDICAL CARE IF:   You have an increase in bruising, swelling, tenderness, heat, or pain over your injury.  You notice coldness of your toes that does not improve after removing a splint or bandage.  Your pain is not lessened after you take medicine.  You have increased difficulty bearing weight on the injured leg, if the injury is in either leg. SEEK IMMEDIATE MEDICAL CARE IF:   You have severe pain near the injured area or severe pain with stretching.  You have increased  swelling that resulted in a tense, hard area or a loss of sensation in the area of the injury.  You have pale, cool skin below the area of the injury (in an extremity) that does not go away after removing a splint or bandage. MAKE SURE YOU:   Understand these instructions.  Will watch your condition.  Will get help right away if you are not doing well or get worse.   This information is not intended to replace advice given to you by your health care provider. Make sure you discuss any questions you have with your health care provider.   Document Released: 03/09/2004 Document Revised: 11/20/2012 Document Reviewed: 07/19/2012 Elsevier Interactive Patient Education 2016 [employer]  

## 2015-03-18 NOTE — ED Notes (Signed)
Pt states that she tripped over dog's leash in laundry room, attempted to brace for fall with her rt forearm hitting the edge of the door, pt has bruising, swelling, and pain to the forearm area with decreased sensation to her finger tips, but is able to move the fingers, pain increased with attempt to supinate and pronate the wrist

## 2015-03-24 ENCOUNTER — Ambulatory Visit: Payer: Self-pay | Admitting: Family Medicine

## 2015-04-21 ENCOUNTER — Emergency Department
Admission: EM | Admit: 2015-04-21 | Discharge: 2015-04-21 | Disposition: A | Discharge status: Home / Self Care | Payer: Medicaid Other | Attending: Emergency Medicine | Admitting: Emergency Medicine

## 2015-04-21 DIAGNOSIS — R509 Fever, unspecified: Secondary | ICD-10-CM | POA: Diagnosis present

## 2015-04-21 DIAGNOSIS — Z793 Long term (current) use of hormonal contraceptives: Secondary | ICD-10-CM | POA: Insufficient documentation

## 2015-04-21 DIAGNOSIS — J020 Streptococcal pharyngitis: Secondary | ICD-10-CM | POA: Diagnosis not present

## 2015-04-21 DIAGNOSIS — Z880 Allergy status to penicillin: Secondary | ICD-10-CM | POA: Insufficient documentation

## 2015-04-21 DIAGNOSIS — Z79899 Other long term (current) drug therapy: Secondary | ICD-10-CM | POA: Insufficient documentation

## 2015-04-21 DIAGNOSIS — Z791 Long term (current) use of non-steroidal anti-inflammatories (NSAID): Secondary | ICD-10-CM | POA: Diagnosis not present

## 2015-04-21 DIAGNOSIS — F17210 Nicotine dependence, cigarettes, uncomplicated: Secondary | ICD-10-CM | POA: Insufficient documentation

## 2015-04-21 DIAGNOSIS — Z7952 Long term (current) use of systemic steroids: Secondary | ICD-10-CM | POA: Diagnosis not present

## 2015-04-21 DIAGNOSIS — Z792 Long term (current) use of antibiotics: Secondary | ICD-10-CM | POA: Diagnosis not present

## 2015-04-21 LAB — POCT RAPID STREP A: STREPTOCOCCUS, GROUP A SCREEN (DIRECT): NEGATIVE

## 2015-04-21 MED ORDER — MAGIC MOUTHWASH W/LIDOCAINE: 5.0000 mL | Freq: Four times a day (QID) | ORAL | Status: DC

## 2015-04-21 MED ORDER — DOXYCYCLINE HYCLATE 100 MG PO TABS: 100.0000 mg | ORAL_TABLET | Freq: Two times a day (BID) | ORAL | Status: DC

## 2015-04-21 NOTE — ED Notes (Signed)
Pt in with co fever and sore throat for 2 days, sons have strep and flu at home.

## 2015-04-21 NOTE — Discharge Instructions (Signed)

## 2015-04-21 NOTE — ED Provider Notes (Signed)
[facility] Emergency Department Provider Note  ____________________________________________  Time seen: Approximately 8:41 PM  I have reviewed the triage vital signs and the nursing notes.   HISTORY  Chief Complaint Fever    HPI Pamela Meyers is a 30 y.o. female presents emergency department complaining of fever and sore throat 2 days. Patient reports that her son has strep her daughter has the flu. Patient states that she has had a temperature up to 104F. Patient states it feels like "sharp glass in my throat." Patient denies any difficulty breathing or swallowing. She is able control the fever with Tylenol and Motrin.   No past medical history on file.  There are no active problems to display for this patient.   Past Surgical History  Procedure Laterality Date  . Appendectomy    . Cholecystectomy    . Tubal ligation      Current Outpatient Rx  Name  Route  Sig  Dispense  Refill  . acetaminophen (TYLENOL) 500 MG tablet   Oral   Take 500 mg by mouth every 6 (six) hours as needed for mild pain, moderate pain or headache.         . albuterol (PROVENTIL HFA;VENTOLIN HFA) 108 (90 BASE) MCG/ACT inhaler   Inhalation   Inhale 2 puffs into the lungs every 4 (four) hours as needed for wheezing or shortness of breath.   1 Inhaler   0   . azithromycin (ZITHROMAX Z-PAK) 250 MG tablet      Take two pills on day 1, one pill a day for days 2-5.   6 each   0   . chlorpheniramine-HYDROcodone (TUSSIONEX PENNKINETIC ER) 10-8 MG/5ML LQCR   Oral   Take 5 mLs by mouth every 12 (twelve) hours as needed for cough.   115 mL   0   . ciprofloxacin (CIPRO) 500 MG tablet   Oral   Take 1 tablet (500 mg total) by mouth 2 (two) times daily.   20 tablet   0   . doxycycline (VIBRA-TABS) 100 MG tablet   Oral   Take 1 tablet (100 mg total) by mouth 2 (two) times daily.   14 tablet   0   . HYDROcodone-acetaminophen (NORCO/VICODIN) 5-325 MG tablet   Oral  Take 1 tablet by mouth every 4 (four) hours as needed for moderate pain.   10 tablet   0   . ibuprofen (ADVIL,MOTRIN) 600 MG tablet   Oral   Take 1 tablet (600 mg total) by mouth every 6 (six) hours as needed.   30 tablet   0   . magic mouthwash w/lidocaine SOLN   Oral   Take 5 mLs by mouth 4 (four) times daily.   240 mL   0     Dispense in a 1/1/1/1 ratio. Use lidocaine, diphen ...   . meloxicam (MOBIC) 15 MG tablet   Oral   Take 1 tablet (15 mg total) by mouth daily.   30 tablet   0   . metroNIDAZOLE (FLAGYL) 500 MG tablet   Oral   Take 1 tablet (500 mg total) by mouth 3 (three) times daily.   30 tablet   0   . mupirocin ointment (BACTROBAN) 2 %      Apply to affected area 3 times daily   22 g   0   . norgestimate-ethinyl estradiol (ORTHO-CYCLEN,SPRINTEC,PREVIFEM) 0.25-35 MG-MCG tablet   Oral   Take 1 tablet by mouth daily.   1 Package  11   . ondansetron (ZOFRAN ODT) 4 MG disintegrating tablet   Oral   Take 1 tablet (4 mg total) by mouth every 6 (six) hours as needed for nausea or vomiting.   20 tablet   0   . oxyCODONE-acetaminophen (ROXICET) 5-325 MG tablet   Oral   Take 1 tablet by mouth every 6 (six) hours as needed for severe pain.   10 tablet   0   . predniSONE (DELTASONE) 10 MG tablet   Oral   Take 1 tablet (10 mg total) by mouth as directed.   21 tablet   0     Take on a daily basis of 6, 5, 4, 3, 2, 1   . sulfamethoxazole-trimethoprim (BACTRIM DS,SEPTRA DS) 800-160 MG per tablet   Oral   Take 1 tablet by mouth 2 (two) times daily.   20 tablet   0     Allergies Penicillins; Ultram; and Morphine and related  No family history on file.  Social History Social History  Substance Use Topics  . Smoking status: Current Every Day Smoker -- 0.75 packs/day for 17 years    Types: Cigarettes  . Smokeless tobacco: Not on file  . Alcohol Use: No     Review of Systems  Constitutional: Positive fever/chills Eyes: No visual changes. No  discharge ENT: Positive sore throat. Denies nasal congestion. Denies ear pain. Cardiovascular: no chest pain. Respiratory: no cough. No SOB. Gastrointestinal: No abdominal pain.  No nausea, no vomiting.   Skin: Negative for rash. Neurological: Negative for headaches, focal weakness or numbness. 10-point ROS otherwise negative.  ____________________________________________   PHYSICAL EXAM:  VITAL SIGNS: ED Triage Vitals  Enc Vitals Group     BP 04/21/15 2004 122/87 mmHg     Pulse Rate 04/21/15 2004 74     Resp 04/21/15 2004 18     Temp 04/21/15 2004 99.1 F (37.3 C)     Temp Source 04/21/15 2004 Oral     SpO2 04/21/15 2004 99 %     Weight 04/21/15 2004 147 lb (66.679 kg)     Height 04/21/15 2004 5' (1.524 m)     Head Cir --      Peak Flow --      Pain Score 04/21/15 2008 7     Pain Loc --      Pain Edu? --      Excl. in [place]? --      Constitutional: Alert and oriented. Well appearing and in no acute distress. Eyes: Conjunctivae are normal. PERRL. EOMI. Head: Atraumatic. ENT:      Ears: EACs and TMs are unremarkable bilaterally.      Nose: No congestion/rhinnorhea.      Mouth/Throat: Mucous membranes are moist. Oropharynx is erythematous. Tonsils are edematous and erythematous with white exudates. Neck: No stridor.   Hematological/Lymphatic/Immunilogical: Diffuse, mobile, tender anterior cervical lymphadenopathy. Cardiovascular: Normal rate, regular rhythm. Normal S1 and S2.  Good peripheral circulation. Respiratory: Normal respiratory effort without tachypnea or retractions. Lungs CTAB. Neurologic:  Normal speech and language. No gross focal neurologic deficits are appreciated.  Skin:  Skin is warm, dry and intact. No rash noted. Psychiatric: Mood and affect are normal. Speech and behavior are normal. Patient exhibits appropriate insight and judgement.   ____________________________________________   LABS (all labs ordered are listed, but only abnormal results are  displayed)  Labs Reviewed  CULTURE, GROUP A STREP [facility])  POCT RAPID STREP A   ____________________________________________  EKG   ____________________________________________  RADIOLOGY  No results found.  ____________________________________________    PROCEDURES  Procedure(s) performed:       Medications - No data to display   ____________________________________________   INITIAL IMPRESSION / ASSESSMENT AND PLAN / ED COURSE  Pertinent labs & imaging results that were available during my care of the patient were reviewed by me and considered in my medical decision making (see chart for details).  Patient's diagnosis is consistent with strep throat. Patient had a negative strep test here in the emergency department. However, patient meets 4 out of 5 sent to her criteria with the only criteria not being met is age under 90.. Patient will be discharged home with prescriptions for antibiotics and magic mouthwash. Patient is to follow up with primary care provider if symptoms persist past this treatment course. Patient is given ED precautions to return to the ED for any worsening or new symptoms.     ____________________________________________  FINAL CLINICAL IMPRESSION(S) / ED DIAGNOSES  Final diagnoses:  Strep throat      NEW MEDICATIONS STARTED DURING THIS VISIT:  New Prescriptions   DOXYCYCLINE (VIBRA-TABS) 100 MG TABLET    Take 1 tablet (100 mg total) by mouth 2 (two) times daily.   MAGIC MOUTHWASH W/LIDOCAINE SOLN    Take 5 mLs by mouth 4 (four) times daily.        This chart was dictated using voice recognition software/Dragon. Despite best efforts to proofread, errors can occur which can change the meaning. Any change was purely unintentional.    Darletta Moll, PA-C 04/21/15 2047  Daymon Larsen, MD 04/21/15 2227

## 2015-04-24 LAB — CULTURE, GROUP A STREP (THRC)

## 2015-06-22 ENCOUNTER — Ambulatory Visit: Payer: Self-pay | Admitting: Family Medicine

## 2015-06-23 ENCOUNTER — Ambulatory Visit (INDEPENDENT_AMBULATORY_CARE_PROVIDER_SITE_OTHER): Payer: Medicaid Other | Admitting: Family Medicine

## 2015-06-23 ENCOUNTER — Encounter: Payer: Self-pay | Admitting: Family Medicine

## 2015-06-23 VITALS — BP 118/78 | HR 100 | Temp 98.7°F | Resp 18 | Ht 60.0 in | Wt 143.2 lb

## 2015-06-23 DIAGNOSIS — H52202 Unspecified astigmatism, left eye: Secondary | ICD-10-CM

## 2015-06-23 DIAGNOSIS — F419 Anxiety disorder, unspecified: Secondary | ICD-10-CM

## 2015-06-23 DIAGNOSIS — F430 Acute stress reaction: Principal | ICD-10-CM

## 2015-06-23 DIAGNOSIS — F411 Generalized anxiety disorder: Secondary | ICD-10-CM | POA: Insufficient documentation

## 2015-06-23 MED ORDER — CLONAZEPAM 0.5 MG PO TABS: 0.5000 mg | ORAL_TABLET | Freq: Two times a day (BID) | ORAL | Status: DC | PRN

## 2015-06-23 NOTE — Progress Notes (Signed)
Name: Pamela Meyers   MRN: [id number]    DOB: 10/15/1985   Date:06/23/2015       Progress Note  Subjective  Chief Complaint  Chief Complaint  Patient presents with  . Referral    HPI  " I am so stressed out" Pt. Presents to discuss stress and anxiety. She reports being under a tremendous amount of stress because her husband was dependent on opioids and now going through a detox program through [facility] and is being prescribed Suboxone. His dependence on opioids cost them their home and belongings.  She reports that their family is now living with her mother in law and trying to get back on her feet.   In addition, she is requesting a referral to an eye doctor for follow up of Astigmatism. She will follow up with [facility].    History reviewed. No pertinent past medical history.  Past Surgical History  Procedure Laterality Date  . Appendectomy    . Cholecystectomy    . Tubal ligation      History reviewed. No pertinent family history.  Social History   Social History  . Marital Status: Married    Spouse Name: N/A  . Number of Children: N/A  . Years of Education: N/A   Occupational History  . Not on file.   Social History Main Topics  . Smoking status: Current Every Day Smoker -- 0.75 packs/day for 17 years    Types: Cigarettes  . Smokeless tobacco: Not on file  . Alcohol Use: No  . Drug Use: No  . Sexual Activity: Yes    Birth Control/ Protection: Surgical     Comment: last sex two days ago   Other Topics Concern  . Not on file   Social History Narrative     Current outpatient prescriptions:  .  acetaminophen (TYLENOL) 500 MG tablet, Take 500 mg by mouth every 6 (six) hours as needed for mild pain, moderate pain or headache. Reported on 06/23/2015, Disp: , Rfl:  .  clonazePAM (KLONOPIN) 0.5 MG tablet, Take 1 tablet (0.5 mg total) by mouth 2 (two) times daily as needed for anxiety., Disp: 60 tablet, Rfl: 0 .  norgestimate-ethinyl  estradiol (ORTHO-CYCLEN,SPRINTEC,PREVIFEM) 0.25-35 MG-MCG tablet, Take 1 tablet by mouth daily. (Patient not taking: Reported on 06/23/2015), Disp: 1 Package, Rfl: 11  Allergies  Allergen Reactions  . Penicillins Hives  . Ultram [Tramadol] Nausea And Vomiting  . Morphine And Related Hives     Review of Systems  Psychiatric/Behavioral: The patient is nervous/anxious.       Objective  Filed Vitals:   06/23/15 1459  BP: 118/78  Pulse: 100  Temp: 98.7 F (37.1 C)  Resp: 18  Height: 5' (1.524 m)  Weight: 143 lb 3 oz (64.949 kg)  SpO2: 98%    Physical Exam  Constitutional: She is well-developed, well-nourished, and in no distress.  Cardiovascular: Normal rate and regular rhythm.   Murmur heard.  Systolic murmur is present with a grade of 2/6  Psychiatric: Memory, affect and judgment normal. Her mood appears anxious.  Nursing note and vitals reviewed.    Assessment & Plan  1. Anxiety in acute stress reaction We will refer to psychiatry for further management of worsening anxiety, started on diazepam 0.5 mg up to twice daily as needed, educated on dependence potential of clonazepam. Follow-up in one month - Ambulatory referral to Psychiatry - clonazePAM (KLONOPIN) 0.5 MG tablet; Take 1 tablet (0.5 mg total) by mouth 2 (  two) times daily as needed for anxiety.  Dispense: 60 tablet; Refill: 0  2. Astigmatism of left eye  - Ambulatory referral to Ophthalmology   [facility] 06/23/2015 5:54 PM

## 2015-06-29 ENCOUNTER — Telehealth: Payer: Self-pay | Admitting: Family Medicine

## 2015-06-29 NOTE — Telephone Encounter (Signed)
Pt said that the medication ( CLONAZEPAM)  that the dr gave her last week is just not working. Still very nervous and said that she received more bad news that her father is now in Hospice. Please advise.

## 2015-07-07 ENCOUNTER — Telehealth: Payer: Self-pay | Admitting: Family Medicine

## 2015-07-07 NOTE — Telephone Encounter (Signed)
Patient had appointment with psychologist and had to reschedule because she is making funeral arrangement for her father. States that clonazepam is not working and thinks it may be because of the stress she is under. Would like to know if you could prescribe something different or a stronger dose.

## 2015-07-09 ENCOUNTER — Ambulatory Visit: Payer: Medicaid Other | Admitting: Psychiatry

## 2015-07-26 ENCOUNTER — Other Ambulatory Visit: Payer: Self-pay | Admitting: Family Medicine

## 2015-07-27 ENCOUNTER — Ambulatory Visit (INDEPENDENT_AMBULATORY_CARE_PROVIDER_SITE_OTHER): Payer: Medicaid Other | Admitting: Family Medicine

## 2015-07-27 ENCOUNTER — Encounter: Payer: Self-pay | Admitting: Family Medicine

## 2015-07-27 VITALS — BP 120/72 | HR 85 | Temp 98.4°F | Resp 16 | Ht 60.0 in | Wt 144.2 lb

## 2015-07-27 DIAGNOSIS — F900 Attention-deficit hyperactivity disorder, predominantly inattentive type: Secondary | ICD-10-CM | POA: Diagnosis not present

## 2015-07-27 DIAGNOSIS — F419 Anxiety disorder, unspecified: Secondary | ICD-10-CM

## 2015-07-27 DIAGNOSIS — F430 Acute stress reaction: Principal | ICD-10-CM

## 2015-07-27 DIAGNOSIS — S0990XA Unspecified injury of head, initial encounter: Secondary | ICD-10-CM | POA: Diagnosis not present

## 2015-07-27 DIAGNOSIS — F411 Generalized anxiety disorder: Secondary | ICD-10-CM

## 2015-07-27 MED ORDER — AMPHETAMINE-DEXTROAMPHET ER 20 MG PO CP24: 20.0000 mg | ORAL_CAPSULE | ORAL | Status: DC

## 2015-07-27 MED ORDER — CLONAZEPAM 1 MG PO TABS: 1.0000 mg | ORAL_TABLET | Freq: Two times a day (BID) | ORAL | Status: DC | PRN

## 2015-07-27 NOTE — Progress Notes (Signed)
Name: Pamela Meyers   MRN: [id number]    DOB: 1986-02-10   Date:07/27/2015       Progress Note  Subjective  Chief Complaint  Chief Complaint  Patient presents with  . Anxiety    medication refiils    Anxiety Presents for follow-up visit. The problem has been gradually worsening. Symptoms include decreased concentration, depressed mood and nervous/anxious behavior. Patient reports no chest pain. Symptoms occur constantly. The severity of symptoms is moderate and causing significant distress.   Risk factors include a major life event (Her father, grandfather, and passed away within a period of 2 weeks.). Past treatments include benzodiazephines. The treatment provided moderate (Clonazepam not working as well with the loss of her father, grandfather, and uncle and the added stress and grief. ) relief. Compliance with prior treatments has been good.  Head Injury  The incident occurred 2 days ago. The injury mechanism was a direct blow (She was taking out a bench from the trunk of  her car and the trunk lid fell down hitting her head. ). There was no loss of consciousness. There was no blood loss. The quality of the pain is described as dull. The pain is at a severity of 0/10 (Pain with touching the affected area. ). The pain is mild. Pertinent negatives include no blurred vision or headaches. She has tried acetaminophen for the symptoms.   Difficulty Concentrating:  Pt. has been experiencing difficulty focusing and concentrating with daily household tasks. She has trouble completing a task before starting a new one and feels like her mind is racing all the time. This is worse with passing of her father, grandfather, and uncle. She is requesting to be started on a medication for 'focusing.'  History reviewed. No pertinent past medical history.  Past Surgical History  Procedure Laterality Date  . Appendectomy    . Cholecystectomy    . Tubal ligation      History reviewed. No pertinent family  history.  Social History   Social History  . Marital Status: Married    Spouse Name: N/A  . Number of Children: N/A  . Years of Education: N/A   Occupational History  . Not on file.   Social History Main Topics  . Smoking status: Current Every Day Smoker -- 0.75 packs/day for 17 years    Types: Cigarettes  . Smokeless tobacco: Not on file  . Alcohol Use: No  . Drug Use: No  . Sexual Activity: Yes    Birth Control/ Protection: Surgical     Comment: last sex two days ago   Other Topics Concern  . Not on file   Social History Narrative     Current outpatient prescriptions:  .  acetaminophen (TYLENOL) 500 MG tablet, Take 500 mg by mouth every 6 (six) hours as needed for mild pain, moderate pain or headache. Reported on 06/23/2015, Disp: , Rfl:  .  clonazePAM (KLONOPIN) 0.5 MG tablet, Take 1 tablet (0.5 mg total) by mouth 2 (two) times daily as needed for anxiety., Disp: 60 tablet, Rfl: 0 .  norgestimate-ethinyl estradiol (ORTHO-CYCLEN,SPRINTEC,PREVIFEM) 0.25-35 MG-MCG tablet, Take 1 tablet by mouth daily. (Patient not taking: Reported on 06/23/2015), Disp: 1 Package, Rfl: 11  Allergies  Allergen Reactions  . Penicillins Hives  . Ultram [Tramadol] Nausea And Vomiting  . Morphine And Related Hives    Review of Systems  Constitutional: Negative for fever and chills.  Eyes: Negative for blurred vision and double vision.  Cardiovascular: Negative for chest pain.  Gastrointestinal: Negative for abdominal pain.  Neurological: Negative for headaches.  Psychiatric/Behavioral: Positive for depression and decreased concentration. The patient is nervous/anxious.     Objective  Filed Vitals:   07/27/15 1453  BP: 120/72  Pulse: 85  Temp: 98.4 F (36.9 C)  TempSrc: Oral  Resp: 16  Height: 5' (1.524 m)  Weight: 144 lb 3.2 oz (65.409 kg)  SpO2: 98%    Physical Exam  Constitutional: She is oriented to person, place, and time and well-developed, well-nourished, and in no  distress.  HENT:  Head: Normocephalic. Head is with contusion.    Tenderness to palpation over the left frontal area on the scalp (consistent with the site of traumatic injury). No erythema or bleeding, or other deformity  Cardiovascular: Normal rate, regular rhythm and normal heart sounds.   Pulmonary/Chest: Effort normal and breath sounds normal.  Neurological: She is alert and oriented to person, place, and time.  Psychiatric: Memory, affect and judgment normal. She exhibits a depressed mood.  Nursing note and vitals reviewed.    Assessment & Plan  1. Anxiety in acute stress reaction Worsening anxiety, we will increase clonazepam to 1 mg twice a day when necessary. Educated on potential adverse effects and the dependence potential for Clonazepam. Follow-up in one month - clonazePAM (KLONOPIN) 1 MG tablet; Take 1 tablet (1 mg total) by mouth 2 (two) times daily as needed for anxiety.  Dispense: 60 tablet; Refill: 0  2. 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 We'll start on Adderall XR 20 mg daily. Discussed in detail the possible adverse effects associated with amphetamine therapy. Patient verbalized agreement and will return in one month for follow-up - amphetamine-dextroamphetamine (ADDERALL XR) 20 MG 24 hr capsule; Take 1 capsule (20 mg total) by mouth every morning.  Dispense: 30 capsule; Refill: 0  3. Head trauma, initial encounter Mild tenderness over the scalp at the site of impact with the trunk lid. No concerning symptoms. Reassured.   Pamela Meyers Asad [facility] 07/27/2015 3:45 PM

## 2015-08-12 ENCOUNTER — Ambulatory Visit (INDEPENDENT_AMBULATORY_CARE_PROVIDER_SITE_OTHER): Payer: Medicaid Other | Admitting: Family Medicine

## 2015-08-12 ENCOUNTER — Encounter: Payer: Self-pay | Admitting: Family Medicine

## 2015-08-12 VITALS — BP 120/89 | HR 120 | Temp 98.9°F | Resp 18 | Ht 60.0 in | Wt 140.4 lb

## 2015-08-12 DIAGNOSIS — N946 Dysmenorrhea, unspecified: Secondary | ICD-10-CM | POA: Diagnosis not present

## 2015-08-12 MED ORDER — MELOXICAM 15 MG PO TABS
15.0000 mg | ORAL_TABLET | Freq: Every day | ORAL | Status: DC
Start: 2015-08-12 — End: 2015-12-17

## 2015-08-12 NOTE — Progress Notes (Signed)
Name: Pamela CitizenHeather N Szczerba   MRN: [id number]    DOB: 14-Jul-1985   Date:08/12/2015       Progress Note  Subjective  Chief Complaint  Chief Complaint  Patient presents with  . Follow-up    1 mo / pain during menstrual cycle    HPI  Pain During Menstrual Cycle: Patient us here for evaluation of pain during her menstrual cycle. She is G3P3, has a normal menstrual cycle but has intense lower abdomen pain and cramping along with nausea throughout the 7 days. She normally follows up with Gynecology but was advised to follow up with PCP due to non-availability. In the past, she has been prescribed Meloxicam for relief of pain, she reports during heavy bleeding, her pain is intense and not relieved with Meloxicam. For pain control, she was prescribed Oxycodone by the OB/GYN, which helped relieve her pain tremendously.   History reviewed. No pertinent past medical history.  Past Surgical History  Procedure Laterality Date  . Appendectomy    . Cholecystectomy    . Tubal ligation      History reviewed. No pertinent family history.  Social History   Social History  . Marital Status: Married    Spouse Name: N/A  . Number of Children: N/A  . Years of Education: N/A   Occupational History  . Not on file.   Social History Main Topics  . Smoking status: Current Every Day Smoker -- 0.75 packs/day for 17 years    Types: Cigarettes  . Smokeless tobacco: Not on file  . Alcohol Use: No  . Drug Use: No  . Sexual Activity: Yes    Birth Control/ Protection: Surgical     Comment: last sex two days ago   Other Topics Concern  . Not on file   Social History Narrative     Current outpatient prescriptions:  .  acetaminophen (TYLENOL) 500 MG tablet, Take 500 mg by mouth every 6 (six) hours as needed for mild pain, moderate pain or headache. Reported on 06/23/2015, Disp: , Rfl:  .  amphetamine-dextroamphetamine (ADDERALL XR) 20 MG 24 hr capsule, Take 1 capsule (20 mg total) by mouth every  morning., Disp: 30 capsule, Rfl: 0 .  clonazePAM (KLONOPIN) 1 MG tablet, Take 1 tablet (1 mg total) by mouth 2 (two) times daily as needed for anxiety., Disp: 60 tablet, Rfl: 0 .  norgestimate-ethinyl estradiol (ORTHO-CYCLEN,SPRINTEC,PREVIFEM) 0.25-35 MG-MCG tablet, Take 1 tablet by mouth daily., Disp: 1 Package, Rfl: 11  Allergies  Allergen Reactions  . Penicillins Hives  . Ultram [Tramadol] Nausea And Vomiting  . Morphine And Related Hives     Review of Systems  Constitutional: Negative for fever and chills.  Gastrointestinal: Positive for nausea and abdominal pain. Negative for vomiting.  Genitourinary: Negative for hematuria.     Objective  Filed Vitals:   08/12/15 1121  BP: 120/89  Pulse: 120  Temp: 98.9 F (37.2 C)  TempSrc: Oral  Resp: 18  Height: 5' (1.524 m)  Weight: 140 lb 6.4 oz (63.685 kg)  SpO2: 98%    Physical Exam  Constitutional: She is oriented to person, place, and time and well-developed, well-nourished, and in no distress.  HENT:  Head: Normocephalic and atraumatic.  Abdominal: Normal appearance and bowel sounds are normal. There is tenderness in the suprapubic area.  Neurological: She is alert and oriented to person, place, and time.  Nursing note and vitals reviewed.     Assessment & Plan  1. Dysmenorrhea Review of nocturnal angina controlled  substances registry database reviews that patient has not been prescribed oxycodone within the last 6 months. I tried to contact her gynecologist, no one was available to talk to me. I informed her that we'll not be able to provide oxycodone at this time. We'll refill meloxicam after reviewing the prescription and information from the patient bottle. Referred to a different gynecologist for continuity of care. - meloxicam (MOBIC) 15 MG tablet; Take 1 tablet (15 mg total) by mouth daily.  Dispense: 7 tablet; Refill: 0 - Ambulatory referral to Obstetrics / Gynecology   Hazel SamsSyed Asad A. Faylene KurtzShah [facility] 08/12/2015 11:28 AM

## 2015-08-19 ENCOUNTER — Ambulatory Visit: Payer: Self-pay | Admitting: Family Medicine

## 2015-08-23 ENCOUNTER — Ambulatory Visit
Admission: RE | Admit: 2015-08-23 | Discharge: 2015-08-23 | Disposition: A | Discharge status: Home / Self Care | Payer: Medicaid Other | Source: Ambulatory Visit | Attending: Family Medicine | Admitting: Family Medicine

## 2015-08-23 ENCOUNTER — Encounter: Payer: Self-pay | Admitting: Family Medicine

## 2015-08-23 ENCOUNTER — Ambulatory Visit (INDEPENDENT_AMBULATORY_CARE_PROVIDER_SITE_OTHER): Payer: Medicaid Other | Admitting: Family Medicine

## 2015-08-23 ENCOUNTER — Encounter (INDEPENDENT_AMBULATORY_CARE_PROVIDER_SITE_OTHER): Payer: Self-pay

## 2015-08-23 VITALS — BP 123/80 | HR 92 | Temp 99.0°F | Resp 17 | Ht 60.0 in | Wt 144.4 lb

## 2015-08-23 DIAGNOSIS — R058 Other specified cough: Secondary | ICD-10-CM

## 2015-08-23 DIAGNOSIS — R05 Cough: Secondary | ICD-10-CM

## 2015-08-23 DIAGNOSIS — R062 Wheezing: Secondary | ICD-10-CM | POA: Diagnosis not present

## 2015-08-23 DIAGNOSIS — H6091 Unspecified otitis externa, right ear: Secondary | ICD-10-CM | POA: Diagnosis not present

## 2015-08-23 DIAGNOSIS — F430 Acute stress reaction: Principal | ICD-10-CM

## 2015-08-23 DIAGNOSIS — F419 Anxiety disorder, unspecified: Secondary | ICD-10-CM

## 2015-08-23 DIAGNOSIS — F900 Attention-deficit hyperactivity disorder, predominantly inattentive type: Secondary | ICD-10-CM

## 2015-08-23 DIAGNOSIS — F411 Generalized anxiety disorder: Secondary | ICD-10-CM

## 2015-08-23 MED ORDER — BENZONATATE 200 MG PO CAPS: 200.0000 mg | ORAL_CAPSULE | Freq: Two times a day (BID) | ORAL | Status: DC | PRN

## 2015-08-23 MED ORDER — CIPROFLOXACIN-DEXAMETHASONE 0.3-0.1 % OT SUSP: 4.0000 [drp] | Freq: Two times a day (BID) | OTIC | Status: DC

## 2015-08-23 MED ORDER — AMPHETAMINE-DEXTROAMPHET ER 20 MG PO CP24: 20.0000 mg | ORAL_CAPSULE | ORAL | Status: DC

## 2015-08-23 MED ORDER — CLONAZEPAM 1 MG PO TABS: 1.0000 mg | ORAL_TABLET | Freq: Two times a day (BID) | ORAL | Status: DC | PRN

## 2015-08-23 MED ORDER — ALBUTEROL SULFATE HFA 108 (90 BASE) MCG/ACT IN AERS: 2.0000 | INHALATION_SPRAY | Freq: Four times a day (QID) | RESPIRATORY_TRACT | Status: DC | PRN

## 2015-08-23 NOTE — Progress Notes (Signed)
Name: Pamela Meyers   MRN: [id number]    DOB: 14-Oct-1985   Date:08/23/2015       Progress Note  Subjective  Chief Complaint  Chief Complaint  Patient presents with  . Medication Refill    Anxiety Presents for follow-up visit. The problem has been gradually improving. Symptoms include excessive worry, irritability, nervous/anxious behavior and panic. Patient reports no depressed mood or insomnia. Symptoms occur most days.   Her past medical history is significant for anxiety/panic attacks. Past treatments include benzodiazephines.    Attention Deficit Disorder:  Pt. Presents for refill on Adderall and follow up of Attention Deficit Disorder, symptoms include difficulty concentrating, being inattentive to details, not finishing a task and starting another one etc. Symptoms are improving on Adderall XR 20 mg started last month, no side effects such as fatigue, weight loss, insomnia, or anorexia.    History reviewed. No pertinent past medical history.  Past Surgical History  Procedure Laterality Date  . Appendectomy    . Cholecystectomy    . Tubal ligation      History reviewed. No pertinent family history.  Social History   Social History  . Marital Status: Married    Spouse Name: N/A  . Number of Children: N/A  . Years of Education: N/A   Occupational History  . Not on file.   Social History Main Topics  . Smoking status: Current Every Day Smoker -- 0.75 packs/day for 17 years    Types: Cigarettes  . Smokeless tobacco: Not on file  . Alcohol Use: No  . Drug Use: No  . Sexual Activity: Yes    Birth Control/ Protection: Surgical     Comment: last sex two days ago   Other Topics Concern  . Not on file   Social History Narrative     Current outpatient prescriptions:  .  acetaminophen (TYLENOL) 500 MG tablet, Take 500 mg by mouth every 6 (six) hours as needed for mild pain, moderate pain or headache. Reported on 06/23/2015, Disp: , Rfl:  .   amphetamine-dextroamphetamine (ADDERALL XR) 20 MG 24 hr capsule, Take 1 capsule (20 mg total) by mouth every morning., Disp: 30 capsule, Rfl: 0 .  clonazePAM (KLONOPIN) 1 MG tablet, Take 1 tablet (1 mg total) by mouth 2 (two) times daily as needed for anxiety., Disp: 60 tablet, Rfl: 0 .  meloxicam (MOBIC) 15 MG tablet, Take 1 tablet (15 mg total) by mouth daily., Disp: 7 tablet, Rfl: 0 .  norgestimate-ethinyl estradiol (ORTHO-CYCLEN,SPRINTEC,PREVIFEM) 0.25-35 MG-MCG tablet, Take 1 tablet by mouth daily., Disp: 1 Package, Rfl: 11  Allergies  Allergen Reactions  . Penicillins Hives  . Ultram [Tramadol] Nausea And Vomiting  . Morphine And Related Hives     Review of Systems  Constitutional: Positive for irritability. Negative for weight loss and malaise/fatigue.  Psychiatric/Behavioral: The patient is nervous/anxious. The patient does not have insomnia.     Objective  Filed Vitals:   08/23/15 1047  BP: 123/80  Pulse: 92  Temp: 99 F (37.2 C)  TempSrc: Oral  Resp: 17  Height: 5' (1.524 m)  Weight: 144 lb 6.4 oz (65.499 kg)  SpO2: 93%    Physical Exam  Constitutional: She is oriented to person, place, and time and well-developed, well-nourished, and in no distress.  HENT:  Right Ear: There is drainage (blood-stained drainage in the right ear canal, TM dark grey colored.) and tenderness. Tympanic membrane is erythematous.  Left Ear: Tympanic membrane and ear canal normal.  Mouth/Throat: Posterior  oropharyngeal erythema present.  Cardiovascular: Regular rhythm.   Murmur heard.  Systolic murmur is present with a grade of 3/6  Pulmonary/Chest: Effort normal. No respiratory distress. She has wheezes. She has no rales.  Neurological: She is alert and oriented to person, place, and time.  Nursing note and vitals reviewed.     Assessment & Plan  1. Anxiety in acute stress reaction Improving, continue on clonazepam as prescribed - clonazePAM (KLONOPIN) 1 MG tablet; Take 1  tablet (1 mg total) by mouth 2 (two) times daily as needed for anxiety.  Dispense: 60 tablet; Refill: 0  2. ADHD, predominantly inattentive type Improving, continue on Adderall extended release every morning. Refills for - amphetamine-dextroamphetamine (ADDERALL XR) 20 MG 24 hr capsule; Take 1 capsule (20 mg total) by mouth every morning.  Dispense: 30 capsule; Refill: 0  3. Otitis externa of right ear Patient recently returned from vacation where [place]. We will start on Ciprodex to cover for pseudomonas aeruginosa - ciprofloxacin-dexamethasone (CIPRODEX) otic suspension; Place 4 drops into the right ear 2 (two) times daily.  Dispense: 7.5 mL; Refill: 1  4. Wheezing on auscultation Starting on albuterol, obtain chest x-ray - albuterol (PROVENTIL HFA;VENTOLIN HFA) 108 (90 Base) MCG/ACT inhaler; Inhale 2 puffs into the lungs every 6 (six) hours as needed for wheezing or shortness of breath.  Dispense: 1 Inhaler; Refill: 0  5. Productive cough  - DG Chest 2 View; Future - benzonatate (TESSALON) 200 MG capsule; Take 1 capsule (200 mg total) by mouth 2 (two) times daily as needed for cough.  Dispense: 20 capsule; Refill: 0   Azalea Cedar Asad A. Faylene KurtzShah [facility] 08/23/2015 11:12 AM

## 2015-08-24 ENCOUNTER — Telehealth: Payer: Self-pay | Admitting: Family Medicine

## 2015-08-24 DIAGNOSIS — J019 Acute sinusitis, unspecified: Secondary | ICD-10-CM | POA: Insufficient documentation

## 2015-08-24 MED ORDER — AZITHROMYCIN 250 MG PO TABS: ORAL_TABLET | ORAL | Status: DC

## 2015-08-24 NOTE — Telephone Encounter (Signed)
Patient is feeling 10 times worse that what she was feeling on yesterday when she was seen. She is asking for a return call for her xray results. Patient tried looking on my chart but was not able to find it. Patient states she is really concerned that it is pneumonia or bronchitis. She can be reached at: [phone] or [phone] or [phone]

## 2015-08-24 NOTE — Telephone Encounter (Signed)
Patient is experiencing sinus pressure with greenish discharge. Called and informed patient of chest x-ray results which are unremarkable. We will start on azithromycin for sinusitis.

## 2015-08-24 NOTE — Telephone Encounter (Signed)
Pt would like a call back about XRAY results. Please call her back.

## 2015-09-23 ENCOUNTER — Encounter: Payer: Self-pay | Admitting: Family Medicine

## 2015-09-23 ENCOUNTER — Ambulatory Visit (INDEPENDENT_AMBULATORY_CARE_PROVIDER_SITE_OTHER): Payer: Medicaid Other | Admitting: Family Medicine

## 2015-09-23 VITALS — BP 126/76 | HR 115 | Temp 98.9°F | Resp 18 | Ht 60.0 in | Wt 143.5 lb

## 2015-09-23 DIAGNOSIS — L089 Local infection of the skin and subcutaneous tissue, unspecified: Secondary | ICD-10-CM | POA: Diagnosis not present

## 2015-09-23 DIAGNOSIS — F430 Acute stress reaction: Secondary | ICD-10-CM

## 2015-09-23 DIAGNOSIS — F411 Generalized anxiety disorder: Secondary | ICD-10-CM

## 2015-09-23 DIAGNOSIS — F900 Attention-deficit hyperactivity disorder, predominantly inattentive type: Secondary | ICD-10-CM | POA: Diagnosis not present

## 2015-09-23 DIAGNOSIS — F419 Anxiety disorder, unspecified: Secondary | ICD-10-CM

## 2015-09-23 MED ORDER — CLONAZEPAM 1 MG PO TABS: 1.0000 mg | ORAL_TABLET | Freq: Two times a day (BID) | ORAL | 0 refills | Status: DC | PRN

## 2015-09-23 MED ORDER — AMPHETAMINE-DEXTROAMPHET ER 20 MG PO CP24: 20.0000 mg | ORAL_CAPSULE | ORAL | 0 refills | Status: DC

## 2015-09-23 MED ORDER — CLINDAMYCIN HCL 300 MG PO CAPS: 300.0000 mg | ORAL_CAPSULE | Freq: Four times a day (QID) | ORAL | 0 refills | Status: DC

## 2015-09-23 NOTE — Progress Notes (Signed)
Name: Pamela Meyers   MRN: [id number]    DOB: [birth year]   Date:09/23/2015       Progress Note  Subjective  Chief Complaint  Chief Complaint  Patient presents with  . Follow-up    1 mo  . Medication Refill    HPI  Anxiety: Pt. Has history of anxiety disorder, symptoms include worrying, almost having a panic attack, emotional distress. She is on Clonazepam 1 mg twice daily as needed, which helps relieve her symptoms. Taking medication only as needed, no side effects.   Attention Deficit Disorder: Pt. Has history of Attention Deficit Disorder, symptoms include  Trouble focusing, finishing a task, getting easily distracted etc. She is on Adderall XR 20 mg daily, symptoms responsive to therapy. No side effects reported except for some weight loss.   Skin Lesion: Pt. Presents for evaluation of a lesion present on her face, onset 3 days ago, lesion came on suddenly, started with upper lip swelling, lesion was painful to the touch ,pain worse with moving her lip. She has history of staphylococcal infections of the face. She denies any history of herpes.   History reviewed. No pertinent past medical history.  Past Surgical History:  Procedure Laterality Date  . APPENDECTOMY    . CHOLECYSTECTOMY    . TUBAL LIGATION      History reviewed. No pertinent family history.  Social History   Social History  . Marital status: Married    Spouse name: N/A  . Number of children: N/A  . Years of education: N/A   Occupational History  . Not on file.   Social History Main Topics  . Smoking status: Current Every Day Smoker    Packs/day: 0.75    Years: 17.00    Types: Cigarettes  . Smokeless tobacco: Never Used  . Alcohol use No  . Drug use: No  . Sexual activity: Yes    Birth control/ protection: Surgical     Comment: last sex two days ago   Other Topics Concern  . Not on file   Social History Narrative  . No narrative on file     Current Outpatient Prescriptions:  .   acetaminophen (TYLENOL) 500 MG tablet, Take 500 mg by mouth every 6 (six) hours as needed for mild pain, moderate pain or headache. Reported on 06/23/2015, Disp: , Rfl:  .  albuterol (PROVENTIL HFA;VENTOLIN HFA) 108 (90 Base) MCG/ACT inhaler, Inhale 2 puffs into the lungs every 6 (six) hours as needed for wheezing or shortness of breath., Disp: 1 Inhaler, Rfl: 0 .  amphetamine-dextroamphetamine (ADDERALL XR) 20 MG 24 hr capsule, Take 1 capsule (20 mg total) by mouth every morning., Disp: 30 capsule, Rfl: 0 .  clonazePAM (KLONOPIN) 1 MG tablet, Take 1 tablet (1 mg total) by mouth 2 (two) times daily as needed for anxiety., Disp: 60 tablet, Rfl: 0 .  meloxicam (MOBIC) 15 MG tablet, Take 1 tablet (15 mg total) by mouth daily., Disp: 7 tablet, Rfl: 0 .  norgestimate-ethinyl estradiol (ORTHO-CYCLEN,SPRINTEC,PREVIFEM) 0.25-35 MG-MCG tablet, Take 1 tablet by mouth daily., Disp: 1 Package, Rfl: 11 .  azithromycin (ZITHROMAX) 250 MG tablet, 2 tabs po day 1, then 1 tab po q day x 4 days. (Patient not taking: Reported on 09/23/2015), Disp: 6 tablet, Rfl: 0 .  benzonatate (TESSALON) 200 MG capsule, Take 1 capsule (200 mg total) by mouth 2 (two) times daily as needed for cough. (Patient not taking: Reported on 09/23/2015), Disp: 20 capsule, Rfl: 0 .  ciprofloxacin-dexamethasone (CIPRODEX)  otic suspension, Place 4 drops into the right ear 2 (two) times daily. (Patient not taking: Reported on 09/23/2015), Disp: 7.5 mL, Rfl: 1  Allergies  Allergen Reactions  . Penicillins Hives  . Ultram [Tramadol] Nausea And Vomiting  . Morphine And Related Hives     Review of Systems  Constitutional: Negative for chills and fever.  Cardiovascular: Negative for chest pain.  Gastrointestinal: Negative for abdominal pain.  Neurological: Negative for dizziness.  Psychiatric/Behavioral: The patient is nervous/anxious.     Objective  Vitals:   09/23/15 1046  BP: 126/76  Pulse: (!) 115  Resp: 18  Temp: 98.9 F (37.2 C)    TempSrc: Oral  SpO2: 98%  Weight: 143 lb 8 oz (65.1 kg)  Height: 5' (1.524 m)    Physical Exam  Constitutional: She is oriented to person, place, and time and well-developed, well-nourished, and in no distress.  HENT:  Mouth/Throat:    papulo-vesicular, tender to palpation lesion over the philtrum, no drainage, adjacent to the lesion is a circular erythematous dome shaped lesion which patient states is a mole.  Cardiovascular: Regular rhythm and normal heart sounds.  Tachycardia present.   Pulmonary/Chest: Breath sounds normal. No respiratory distress. She has no wheezes.  Neurological: She is alert and oriented to person, place, and time.  Nursing note and vitals reviewed.   Assessment & Plan  1. ADHD, predominantly inattentive type Stable, responsive to Adderall. - amphetamine-dextroamphetamine (ADDERALL XR) 20 MG 24 hr capsule; Take 1 capsule (20 mg total) by mouth every morning.  Dispense: 30 capsule; Refill: 0  2. 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 Advised to use Aleve for pain relief - clindamycin (CLEOCIN) 300 MG capsule; Take 1 capsule (300 mg total) by mouth 4 (four) times daily.  Dispense: 28 capsule; Refill: 0  3. Anxiety in acute stress reaction Stable, responsive to clonazepam. Refills provided - clonazePAM (KLONOPIN) 1 MG tablet; Take 1 tablet (1 mg total) by mouth 2 (two) times daily as needed for anxiety.  Dispense: 60 tablet; Refill: 0   Rendy Lazard Asad A. Faylene KurtzShah [facility] 09/23/2015 11:08 AM

## 2015-09-24 ENCOUNTER — Telehealth: Payer: Self-pay | Admitting: Family Medicine

## 2015-09-24 NOTE — Telephone Encounter (Signed)
Returned call and left a voice message for the patient. 

## 2015-09-28 NOTE — Telephone Encounter (Signed)
Patient reports that the lesion on her upper lip is no better after taking the antibiotic, it is swollen and painful. We will request a return visit to reevaluate her symptoms and perform further testing. Please schedule for the earliest available appointment

## 2015-09-29 NOTE — Telephone Encounter (Signed)
Tried calling patient this morning and she would not wake up. Went to sleep while I was talking to her. Called back this afternoon no answer had to leave message.

## 2015-10-19 ENCOUNTER — Encounter: Payer: Self-pay | Admitting: Family Medicine

## 2015-10-19 ENCOUNTER — Ambulatory Visit (INDEPENDENT_AMBULATORY_CARE_PROVIDER_SITE_OTHER): Payer: Medicaid Other | Admitting: Family Medicine

## 2015-10-19 VITALS — BP 118/80 | HR 112 | Temp 99.0°F | Resp 18 | Wt 143.2 lb

## 2015-10-19 DIAGNOSIS — F411 Generalized anxiety disorder: Secondary | ICD-10-CM

## 2015-10-19 DIAGNOSIS — L089 Local infection of the skin and subcutaneous tissue, unspecified: Secondary | ICD-10-CM

## 2015-10-19 DIAGNOSIS — F900 Attention-deficit hyperactivity disorder, predominantly inattentive type: Secondary | ICD-10-CM

## 2015-10-19 DIAGNOSIS — B9689 Other specified bacterial agents as the cause of diseases classified elsewhere: Secondary | ICD-10-CM

## 2015-10-19 MED ORDER — AMPHETAMINE-DEXTROAMPHET ER 20 MG PO CP24: 20.0000 mg | ORAL_CAPSULE | ORAL | 0 refills | Status: DC

## 2015-10-19 MED ORDER — DOXYCYCLINE HYCLATE 100 MG PO TABS
100.0000 mg | ORAL_TABLET | Freq: Two times a day (BID) | ORAL | 0 refills | Status: DC
Start: 2015-10-19 — End: 2015-12-17

## 2015-10-19 MED ORDER — OXYCODONE-ACETAMINOPHEN 5-325 MG PO TABS: 1.0000 | ORAL_TABLET | Freq: Three times a day (TID) | ORAL | 0 refills | Status: DC | PRN

## 2015-10-19 MED ORDER — MUPIROCIN 2 % EX OINT: 1.0000 "application " | TOPICAL_OINTMENT | Freq: Three times a day (TID) | CUTANEOUS | 0 refills | Status: DC

## 2015-10-19 MED ORDER — CLONAZEPAM 1 MG PO TABS: 1.0000 mg | ORAL_TABLET | Freq: Two times a day (BID) | ORAL | 0 refills | Status: DC | PRN

## 2015-10-19 NOTE — Progress Notes (Signed)
Name: Pamela Meyers   MRN: [id number]    DOB: March 03, 1985   Date:10/19/2015       Progress Note  Subjective  Chief Complaint  Chief Complaint  Patient presents with  . Dental Pain    left side swollen and pain    HPI Skin Lesion: Onset yestrday, started with a painful raised lesion on the left side of her philtrum, it appeared raised, surrounding skin was red, then turned into an inflamed blister as the day went on. It is pertinent to mention that she had a similar lesion on the right side of philtrum treated with Clindamycin last month. She apparently has hsitory of frequent staph infections. Her pain rated at 7/10, worse with talking. She is requesting something for pain.  No past medical history on file.  Past Surgical History:  Procedure Laterality Date  . APPENDECTOMY    . CHOLECYSTECTOMY    . TUBAL LIGATION      No family history on file.  Social History   Social History  . Marital status: Married    Spouse name: N/A  . Number of children: N/A  . Years of education: N/A   Occupational History  . Not on file.   Social History Main Topics  . Smoking status: Current Every Day Smoker    Packs/day: 0.75    Years: 17.00    Types: Cigarettes  . Smokeless tobacco: Never Used  . Alcohol use No  . Drug use: No  . Sexual activity: Yes    Birth control/ protection: Surgical     Comment: last sex two days ago   Other Topics Concern  . Not on file   Social History Narrative  . No narrative on file     Current Outpatient Prescriptions:  .  acetaminophen (TYLENOL) 500 MG tablet, Take 500 mg by mouth every 6 (six) hours as needed for mild pain, moderate pain or headache. Reported on 06/23/2015, Disp: , Rfl:  .  albuterol (PROVENTIL HFA;VENTOLIN HFA) 108 (90 Base) MCG/ACT inhaler, Inhale 2 puffs into the lungs every 6 (six) hours as needed for wheezing or shortness of breath., Disp: 1 Inhaler, Rfl: 0 .  amphetamine-dextroamphetamine (ADDERALL XR) 20 MG 24 hr capsule,  Take 1 capsule (20 mg total) by mouth every morning., Disp: 30 capsule, Rfl: 0 .  clindamycin (CLEOCIN) 300 MG capsule, Take 1 capsule (300 mg total) by mouth 4 (four) times daily., Disp: 28 capsule, Rfl: 0 .  clonazePAM (KLONOPIN) 1 MG tablet, Take 1 tablet (1 mg total) by mouth 2 (two) times daily as needed for anxiety., Disp: 60 tablet, Rfl: 0 .  meloxicam (MOBIC) 15 MG tablet, Take 1 tablet (15 mg total) by mouth daily., Disp: 7 tablet, Rfl: 0 .  norgestimate-ethinyl estradiol (ORTHO-CYCLEN,SPRINTEC,PREVIFEM) 0.25-35 MG-MCG tablet, Take 1 tablet by mouth daily., Disp: 1 Package, Rfl: 11  Allergies  Allergen Reactions  . Penicillins Hives  . Ultram [Tramadol] Nausea And Vomiting  . Morphine And Related Hives     Review of Systems  Constitutional: Negative for chills and fever.  HENT: Negative for congestion and sore throat.   Cardiovascular: Negative for chest pain.    Objective  Vitals:   10/19/15 1137  BP: 118/80  Pulse: (!) 112  Resp: 18  Temp: 99 F (37.2 C)  SpO2: 99%  Weight: 143 lb 3 oz (64.9 kg)    Physical Exam  Constitutional: She is oriented to person, place, and time and well-developed, well-nourished, and in no distress.  HENT:  Mouth/Throat: Posterior oropharyngeal erythema present.    Cardiovascular: Normal rate, regular rhythm, S1 normal and S2 normal.   No murmur heard. Pulmonary/Chest: Effort normal and breath sounds normal. She has no wheezes.  Neurological: She is alert and oriented to person, place, and time.  Skin: Lesion noted.  On the left side of philtrum, there is a papular erythematous, well defined, non draining lesion, which is tender to palpation.  Psychiatric: Mood, memory, affect and judgment normal.  Nursing note and vitals reviewed.     Assessment & Plan  1. ADHD, predominantly inattentive type Stable, responsive to Adderall, refills provided - amphetamine-dextroamphetamine (ADDERALL XR) 20 MG 24 hr capsule; Take 1 capsule  (20 mg total) by mouth every morning.  Dispense: 30 capsule; Refill: 0  2. Superficial bacterial skin infection We will start on doxycycline plus mupirocin for suspected localized impetigo, obtain herpes serologies to rule out recurrent oral herpes outbreak. - doxycycline (VIBRA-TABS) 100 MG tablet; Take 1 tablet (100 mg total) by mouth 2 (two) times daily.  Dispense: 14 tablet; Refill: 0 - mupirocin ointment (BACTROBAN) 2 %; Place 1 application into the nose 3 (three) times daily.  Dispense: 22 g; Refill: 0 - HSV(herpes smplx)abs-1+2(IgG+IgM)-bld - oxyCODONE-acetaminophen (PERCOCET/ROXICET) 5-325 MG tablet; Take 1 tablet by mouth every 8 (eight) hours as needed for severe pain.  Dispense: 15 tablet; Refill: 0  3. Generalized anxiety disorder Stable, responsive to clonazepam taken as needed. - clonazePAM (KLONOPIN) 1 MG tablet; Take 1 tablet (1 mg total) by mouth 2 (two) times daily as needed for anxiety.  Dispense: 60 tablet; Refill: 0   Macauley Mossberg Asad A. Faylene KurtzShah [facility] 10/19/2015 11:43 AM

## 2015-10-21 ENCOUNTER — Encounter: Payer: Self-pay | Admitting: Family Medicine

## 2015-10-21 ENCOUNTER — Other Ambulatory Visit: Payer: Self-pay | Admitting: Family Medicine

## 2015-10-21 DIAGNOSIS — B002 Herpesviral gingivostomatitis and pharyngotonsillitis: Secondary | ICD-10-CM | POA: Insufficient documentation

## 2015-10-21 LAB — HSV(HERPES SMPLX)ABS-I+II(IGG+IGM)-BLD
HSV 1 Glycoprotein G Ab, IgG: 23.8 Index — ABNORMAL HIGH (ref <0.90)
HSV 2 GLYCOPROTEIN G AB, IGG: 1.75 {index} — AB (ref <0.90)
Herpes Simplex Vrs I&II-IgM Ab (EIA): 1.31 INDEX — ABNORMAL HIGH

## 2015-10-21 MED ORDER — VALACYCLOVIR HCL 1 G PO TABS: 2000.0000 mg | ORAL_TABLET | Freq: Two times a day (BID) | ORAL | 0 refills | Status: AC

## 2015-10-25 ENCOUNTER — Ambulatory Visit: Payer: Self-pay | Admitting: Family Medicine

## 2015-10-27 ENCOUNTER — Encounter: Payer: Medicaid Other | Admitting: Obstetrics and Gynecology

## 2015-11-18 ENCOUNTER — Encounter: Payer: Self-pay | Admitting: Family Medicine

## 2015-11-18 ENCOUNTER — Ambulatory Visit (INDEPENDENT_AMBULATORY_CARE_PROVIDER_SITE_OTHER): Payer: Medicaid Other | Admitting: Family Medicine

## 2015-11-18 VITALS — BP 110/64 | HR 83 | Temp 98.1°F | Resp 16 | Ht 60.0 in | Wt 148.7 lb

## 2015-11-18 DIAGNOSIS — Z833 Family history of diabetes mellitus: Secondary | ICD-10-CM | POA: Diagnosis not present

## 2015-11-18 DIAGNOSIS — R600 Localized edema: Secondary | ICD-10-CM

## 2015-11-18 DIAGNOSIS — F900 Attention-deficit hyperactivity disorder, predominantly inattentive type: Secondary | ICD-10-CM | POA: Diagnosis not present

## 2015-11-18 DIAGNOSIS — F411 Generalized anxiety disorder: Secondary | ICD-10-CM

## 2015-11-18 LAB — POCT GLYCOSYLATED HEMOGLOBIN (HGB A1C): Hemoglobin A1C: 5.6

## 2015-11-18 MED ORDER — AMPHETAMINE-DEXTROAMPHETAMINE 10 MG PO TABS: 10.0000 mg | ORAL_TABLET | Freq: Two times a day (BID) | ORAL | 0 refills | Status: DC

## 2015-11-18 MED ORDER — CLONAZEPAM 1 MG PO TABS
1.0000 mg | ORAL_TABLET | Freq: Two times a day (BID) | ORAL | 0 refills | Status: DC | PRN
Start: 2015-11-18 — End: 2015-12-17

## 2015-11-18 NOTE — Progress Notes (Signed)
Name: Pamela Meyers   MRN: [id number]    DOB: 10-27-1985   Date:11/18/2015       Progress Note  Subjective  Chief Complaint  Chief Complaint  Patient presents with  . Follow-up    medication refills 1 month follow up   HPI  Attention Deficit Disorder: Inattentive spectrum, symptoms include trouble focusing, unable to finish tasks on time, starting multiple items at one time, short attention span etc. She is on Adderall XR 20 mg capsule and requesting to change to a smaller tablet for her to easily swallow. Adderall works well otherwise. 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  She is also concerned about bilateral leg swelling, started after she started her job as a Child psychotherapist at [employer]. Pain worse with standing, weightbearing and walking, she has bought comfortable and soft tissues which seemed to help to some extent.   No past medical history on file.  Past Surgical History:  Procedure Laterality Date  . APPENDECTOMY    . CHOLECYSTECTOMY    . TUBAL LIGATION      No family history on file.  Social History   Social History  . Marital status: Married    Spouse name: N/A  . Number of children: N/A  . Years of education: N/A   Occupational History  . Not on file.   Social History Main Topics  . Smoking status: Current Every Day Smoker    Packs/day: 0.75    Years: 17.00    Types: Cigarettes  . Smokeless tobacco: Never Used  . Alcohol use No  . Drug use: No  . Sexual activity: Yes    Birth control/ protection: Surgical     Comment: last sex two days ago   Other Topics Concern  . Not on file   Social History Narrative  . No narrative on file     Current Outpatient Prescriptions:  .  acetaminophen (TYLENOL) 500 MG tablet, Take 500 mg by mouth every 6 (six) hours as needed for mild  pain, moderate pain or headache. Reported on 06/23/2015, Disp: , Rfl:  .  albuterol (PROVENTIL HFA;VENTOLIN HFA) 108 (90 Base) MCG/ACT inhaler, Inhale 2 puffs into the lungs every 6 (six) hours as needed for wheezing or shortness of breath., Disp: 1 Inhaler, Rfl: 0 .  amphetamine-dextroamphetamine (ADDERALL XR) 20 MG 24 hr capsule, Take 1 capsule (20 mg total) by mouth every morning., Disp: 30 capsule, Rfl: 0 .  clindamycin (CLEOCIN) 300 MG capsule, Take 1 capsule (300 mg total) by mouth 4 (four) times daily., Disp: 28 capsule, Rfl: 0 .  clonazePAM (KLONOPIN) 1 MG tablet, Take 1 tablet (1 mg total) by mouth 2 (two) times daily as needed for anxiety., Disp: 60 tablet, Rfl: 0 .  doxycycline (VIBRA-TABS) 100 MG tablet, Take 1 tablet (100 mg total) by mouth 2 (two) times daily., Disp: 14 tablet, Rfl: 0 .  meloxicam (MOBIC) 15 MG tablet, Take 1 tablet (15 mg total) by mouth daily., Disp: 7 tablet, Rfl: 0 .  mupirocin ointment (BACTROBAN) 2 %, Place 1 application into the nose 3 (three) times daily., Disp: 22 g, Rfl: 0 .  norgestimate-ethinyl estradiol (ORTHO-CYCLEN,SPRINTEC,PREVIFEM) 0.25-35 MG-MCG tablet, Take 1 tablet by mouth daily., Disp: 1 Package, Rfl: 11 .  oxyCODONE-acetaminophen (PERCOCET/ROXICET) 5-325 MG  tablet, Take 1 tablet by mouth every 8 (eight) hours as needed for severe pain., Disp: 15 tablet, Rfl: 0  Allergies  Allergen Reactions  . Penicillins Hives  . Ultram [Tramadol] Nausea And Vomiting  . Morphine And Related Hives  . Codeine Palpitations     Review of Systems  Respiratory: Negative for cough.   Cardiovascular: Positive for leg swelling. Negative for chest pain.  Psychiatric/Behavioral: The patient is nervous/anxious and has insomnia.     Objective  Vitals:   11/18/15 0943  BP: 110/64  Pulse: 83  Resp: 16  Temp: 98.1 F (36.7 C)  TempSrc: Oral  SpO2: 94%  Weight: 148 lb 11.2 oz (67.4 kg)  Height: 5' (1.524 m)    Physical Exam  Constitutional: She is  well-developed, well-nourished, and in no distress.  Cardiovascular: Normal rate, regular rhythm, S1 normal and S2 normal.   Murmur heard.  Systolic murmur is present with a grade of 3/6  Pulmonary/Chest: Effort normal and breath sounds normal. She has no rhonchi.  Abdominal: Soft. There is no tenderness.  Musculoskeletal:       Right ankle: She exhibits swelling.       Left ankle: She exhibits swelling.  Tenderness to palpation over both lower extremities, 1+ pitting edema on right leg, trace pitting edema on left leg. No erythema.  Psychiatric: Mood, memory, affect and judgment normal.  Nursing note and vitals reviewed.     Assessment & Plan  1. ADHD, predominantly inattentive type Changed to Adderall 10 mg twice a day, tablet should be easier to swallow than capsule, she will take 1 tablet in the morning and second one around early afternoon - amphetamine-dextroamphetamine (ADDERALL) 10 MG tablet; Take 1 tablet (10 mg total) by mouth 2 (two) times daily with a meal.  Dispense: 60 tablet; Refill: 0  2. Generalized anxiety disorder Stable, continue on clonazepam as prescribed. Patient compliant with controlled substances agreement and understands the dependence potential, side effects, and drug interactions of benzodiazepines - clonazePAM (KLONOPIN) 1 MG tablet; Take 1 tablet (1 mg total) by mouth 2 (two) times daily as needed for anxiety.  Dispense: 60 tablet; Refill: 0  3. Family history of diabetes mellitus in maternal grandmother Patient wishes to make sure she does not have diabetes, A1c is 5.6% - POCT HgB A1C  4. Bilateral leg edema Advised to elevate her legs, swelling should improve as she gets used to her work. Advised to return if she has pain along with swelling in her legs, erythema or other concerning symptoms.   Adoni Greenough Asad A. Faylene KurtzShah [facility] 11/18/2015 9:56 AM

## 2015-11-29 ENCOUNTER — Telehealth: Payer: Self-pay | Admitting: Family Medicine

## 2015-11-29 DIAGNOSIS — F900 Attention-deficit hyperactivity disorder, predominantly inattentive type: Secondary | ICD-10-CM

## 2015-11-29 NOTE — Telephone Encounter (Signed)
It is recommended to increase Adderall by 5 mg every week. We can increase Adderall to 15 mg in the morning and keep 10 mg in the afternoon the same for now. Please confirm with patient and we can prevent a new prescription for her to pick up.

## 2015-11-29 NOTE — Telephone Encounter (Signed)
Pt said that the Dr told her to call back if her Adderral was not working for the Dr split it up on her last visit. She was taking the time release and then he split it to her taking half in the morning and half in the afternoon. Could she get a stronger doze maybe or take  20mg  in the morning and 10mg  in the afternoon for it is wearing off.

## 2015-11-30 MED ORDER — AMPHETAMINE-DEXTROAMPHETAMINE 15 MG PO TABS: 15.0000 mg | ORAL_TABLET | Freq: Every day | ORAL | 0 refills | Status: DC

## 2015-11-30 NOTE — Telephone Encounter (Signed)
Per dr Sherryll Burgershah send this to [facility] to call patient about the dosage and then he will do a rx

## 2015-11-30 NOTE — Telephone Encounter (Signed)
Prescription for Adderall 15 mg daily is ready for pickup

## 2015-11-30 NOTE — Telephone Encounter (Signed)
Prescription for Adderall 15 mg daily is ready for pickup 

## 2015-11-30 NOTE — Telephone Encounter (Signed)
Spoke with patient and confirmed medication dosage change per Dr. Sherryll BurgerShah adderall 15 mg in the morning and 10 mg in the evening, prescription will be printed and ready for patient pick up, pt has been notified, Routed to Dr. Sherryll BurgerShah for new prescription

## 2015-12-17 ENCOUNTER — Ambulatory Visit (INDEPENDENT_AMBULATORY_CARE_PROVIDER_SITE_OTHER): Payer: Medicaid Other | Admitting: Family Medicine

## 2015-12-17 ENCOUNTER — Encounter: Payer: Self-pay | Admitting: Family Medicine

## 2015-12-17 DIAGNOSIS — F900 Attention-deficit hyperactivity disorder, predominantly inattentive type: Secondary | ICD-10-CM

## 2015-12-17 DIAGNOSIS — F411 Generalized anxiety disorder: Secondary | ICD-10-CM | POA: Diagnosis not present

## 2015-12-17 MED ORDER — AMPHETAMINE-DEXTROAMPHETAMINE 15 MG PO TABS: 15.0000 mg | ORAL_TABLET | Freq: Two times a day (BID) | ORAL | 0 refills | Status: DC

## 2015-12-17 MED ORDER — CLONAZEPAM 1 MG PO TABS: 1.0000 mg | ORAL_TABLET | Freq: Two times a day (BID) | ORAL | 0 refills | Status: DC | PRN

## 2015-12-17 NOTE — Progress Notes (Signed)
Name: Pamela CitizenHeather N Tones   MRN: [id number]    DOB: [birth year]   Date:12/17/2015       Progress Note  Subjective  Chief Complaint  Chief Complaint  Patient presents with  . Follow-up    medication refills    HPI  Attention Deficit Disorder: Pt. Presents for medication refill on Adderall, she has trouble focusing, especially with her kids and at work. She usually starts one task and goes to another before finishing that first task. She has been taking Adderall 15 mg in morning and 10 mg in evening, she will like to change it to the same dosing both times. She has had no side effects from Adderall.  Anxiety: Main symptoms include nervousness, stress, has had panic attacks. She lives with her mother-in-law and that stresses her out. She takes Clonazepam mainly to help relax herself. No side effects reported on Clonazepam. Taking medication as directed  Past Medical History:  Diagnosis Date  . Anxiety   . Attention deficit disorder (ADD) without hyperactivity     Past Surgical History:  Procedure Laterality Date  . APPENDECTOMY    . CHOLECYSTECTOMY    . TUBAL LIGATION      No family history on file.  Social History   Social History  . Marital status: Married    Spouse name: N/A  . Number of children: N/A  . Years of education: N/A   Occupational History  . Not on file.   Social History Main Topics  . Smoking status: Current Every Day Smoker    Packs/day: 0.75    Years: 17.00    Types: Cigarettes  . Smokeless tobacco: Never Used  . Alcohol use No  . Drug use: No  . Sexual activity: Yes    Birth control/ protection: Surgical     Comment: last sex two days ago   Other Topics Concern  . Not on file   Social History Narrative  . No narrative on file     Current Outpatient Prescriptions:  .  acetaminophen (TYLENOL) 500 MG tablet, Take 500 mg by mouth every 6 (six) hours as needed for mild pain, moderate pain or headache. Reported on 06/23/2015, Disp: , Rfl:  .   amphetamine-dextroamphetamine (ADDERALL) 15 MG tablet, Take 1 tablet by mouth daily with breakfast., Disp: 30 tablet, Rfl: 0 .  clonazePAM (KLONOPIN) 1 MG tablet, Take 1 tablet (1 mg total) by mouth 2 (two) times daily as needed for anxiety., Disp: 60 tablet, Rfl: 0 .  norgestimate-ethinyl estradiol (ORTHO-CYCLEN,SPRINTEC,PREVIFEM) 0.25-35 MG-MCG tablet, Take 1 tablet by mouth daily., Disp: 1 Package, Rfl: 11  Allergies  Allergen Reactions  . Penicillins Hives  . Ultram [Tramadol] Nausea And Vomiting  . Morphine And Related Hives  . Codeine Palpitations     Review of Systems  Constitutional: Negative for chills, fever and malaise/fatigue.  Respiratory: Negative for shortness of breath.   Cardiovascular: Negative for chest pain.  Gastrointestinal: Negative for abdominal pain.  Psychiatric/Behavioral: The patient is nervous/anxious and has insomnia.     Objective  Vitals:   12/17/15 0902  BP: 110/70  Pulse: 85  Resp: 16  Temp: 98.3 F (36.8 C)  TempSrc: Oral  SpO2: 97%  Weight: 139 lb 11.2 oz (63.4 kg)  Height: 5' (1.524 m)    Physical Exam  Constitutional: She is oriented to person, place, and time and well-developed, well-nourished, and in no distress.  HENT:  Head: Normocephalic and atraumatic.  Cardiovascular: Normal rate, regular rhythm and normal heart sounds.  No murmur heard. Pulmonary/Chest: Effort normal and breath sounds normal. She has no wheezes.  Neurological: She is alert and oriented to person, place, and time.  Psychiatric: Mood, memory, affect and judgment normal.  Nursing note and vitals reviewed.      Assessment & Plan  1. ADHD, predominantly inattentive type We'll increase Adderall to 15 mg twice a day, refills provided and follow-up in one month - amphetamine-dextroamphetamine (ADDERALL) 15 MG tablet; Take 1 tablet by mouth 2 (two) times daily.  Dispense: 60 tablet; Refill: 0  2. Generalized anxiety disorder Stable and responsive to  clonazepam taken twice daily as needed. Refills provided and follow-up in one month - clonazePAM (KLONOPIN) 1 MG tablet; Take 1 tablet (1 mg total) by mouth 2 (two) times daily as needed for anxiety.  Dispense: 60 tablet; Refill: 0   Annalyse Langlais Asad A. Faylene KurtzShah [facility] 12/17/2015 9:30 AM

## 2015-12-27 ENCOUNTER — Other Ambulatory Visit: Payer: Self-pay | Admitting: Family Medicine

## 2016-01-05 ENCOUNTER — Ambulatory Visit (INDEPENDENT_AMBULATORY_CARE_PROVIDER_SITE_OTHER): Payer: Medicaid Other | Admitting: Family Medicine

## 2016-01-05 ENCOUNTER — Encounter: Payer: Self-pay | Admitting: Family Medicine

## 2016-01-05 DIAGNOSIS — F411 Generalized anxiety disorder: Secondary | ICD-10-CM | POA: Diagnosis not present

## 2016-01-05 DIAGNOSIS — F900 Attention-deficit hyperactivity disorder, predominantly inattentive type: Secondary | ICD-10-CM

## 2016-01-05 MED ORDER — CLONAZEPAM 1 MG PO TABS: 1.0000 mg | ORAL_TABLET | Freq: Two times a day (BID) | ORAL | 0 refills | Status: DC | PRN

## 2016-01-05 MED ORDER — AMPHETAMINE-DEXTROAMPHETAMINE 15 MG PO TABS: 15.0000 mg | ORAL_TABLET | Freq: Two times a day (BID) | ORAL | 0 refills | Status: DC

## 2016-01-05 NOTE — Progress Notes (Signed)
Name: Pamela Meyers   MRN: [id number]    DOB: Dec 16, 1985   Date:01/05/2016       Progress Note  Subjective  Chief Complaint  Chief Complaint  Patient presents with  . Follow-up    1 mo  . Medication Refill    HPI  Attention Deficit Disorder: Pt. Presents for medication refill on Adderall, she has trouble focusing, especially with her kids and at work. She usually starts one task and goes to another before finishing that first task. She has been taking Adderall 15 mg twice a day. This seems to help. She was recently asked to leave by her mother in law (where she had been living) and does not have access to her prescriptions for Adderall and Clonazepam and have not taken them for 4 days.  Anxiety: Main symptoms include nervousness, stress, has had panic attacks. She used to live with her mother-in-law but was asked to leave this past weekend and does not have her prescriptions with her. Otherwise Clonazepam is working well.   Past Medical History:  Diagnosis Date  . Anxiety   . Attention deficit disorder (ADD) without hyperactivity     Past Surgical History:  Procedure Laterality Date  . APPENDECTOMY    . CHOLECYSTECTOMY    . TUBAL LIGATION      History reviewed. No pertinent family history.  Social History   Social History  . Marital status: Married    Spouse name: N/A  . Number of children: N/A  . Years of education: N/A   Occupational History  . Not on file.   Social History Main Topics  . Smoking status: Current Every Day Smoker    Packs/day: 0.75    Years: 17.00    Types: Cigarettes  . Smokeless tobacco: Never Used  . Alcohol use No  . Drug use: No  . Sexual activity: Yes    Birth control/ protection: Surgical     Comment: last sex two days ago   Other Topics Concern  . Not on file   Social History Narrative  . No narrative on file     Current Outpatient Prescriptions:  .  acetaminophen (TYLENOL) 500 MG tablet, Take 500 mg by mouth every 6  (six) hours as needed for mild pain, moderate pain or headache. Reported on 06/23/2015, Disp: , Rfl:  .  amphetamine-dextroamphetamine (ADDERALL) 15 MG tablet, Take 1 tablet by mouth 2 (two) times daily., Disp: 60 tablet, Rfl: 0 .  clonazePAM (KLONOPIN) 1 MG tablet, Take 1 tablet (1 mg total) by mouth 2 (two) times daily as needed for anxiety., Disp: 60 tablet, Rfl: 0 .  norgestimate-ethinyl estradiol (ORTHO-CYCLEN,SPRINTEC,PREVIFEM) 0.25-35 MG-MCG tablet, Take 1 tablet by mouth daily., Disp: 1 Package, Rfl: 11  Allergies  Allergen Reactions  . Penicillins Hives  . Ultram [Tramadol] Nausea And Vomiting  . Morphine And Related Hives  . Codeine Palpitations     ROS  Please see history of present illness for complete discussion of review of systems  Objective  Vitals:   01/05/16 0934  BP: 108/78  Pulse: (!) 103  Resp: 17  Temp: 98.7 F (37.1 C)  TempSrc: Oral  SpO2: 98%  Weight: 140 lb 1.6 oz (63.5 kg)  Height: 5' (1.524 m)    Physical Exam  Constitutional: She is oriented to person, place, and time and well-developed, well-nourished, and in no distress.  HENT:  Head: Normocephalic and atraumatic.  Cardiovascular: Normal rate, regular rhythm and normal heart sounds.   No murmur  heard. Pulmonary/Chest: Effort normal and breath sounds normal. She has no wheezes.  Neurological: She is alert and oriented to person, place, and time.  Psychiatric: Mood, memory, affect and judgment normal.  Nursing note and vitals reviewed.     Assessment & Plan  1. ADHD, predominantly inattentive type Stable, continue on Adderall, explained that we'll be unable to authorize a refill for Adderall prior to 01/16/2016 - amphetamine-dextroamphetamine (ADDERALL) 15 MG tablet; Take 1 tablet by mouth 2 (two) times daily.  Dispense: 60 tablet; Refill: 0  2. Generalized anxiety disorder Stable, continue on clonazepam, explained that we'll be unable to authorize a refill for clonazepam prior to  01/16/2016 - clonazePAM (KLONOPIN) 1 MG tablet; Take 1 tablet (1 mg total) by mouth 2 (two) times daily as needed for anxiety.  Dispense: 60 tablet; Refill: 0   Pamela Meyers Asad A. Faylene KurtzShah [facility] 01/05/2016 10:11 AM

## 2016-01-14 ENCOUNTER — Ambulatory Visit: Payer: Self-pay | Admitting: Family Medicine

## 2016-01-20 ENCOUNTER — Encounter: Payer: Self-pay | Admitting: Family Medicine

## 2016-02-16 ENCOUNTER — Ambulatory Visit (INDEPENDENT_AMBULATORY_CARE_PROVIDER_SITE_OTHER): Payer: Medicaid Other | Admitting: Family Medicine

## 2016-02-16 ENCOUNTER — Encounter: Payer: Self-pay | Admitting: Family Medicine

## 2016-02-16 VITALS — BP 110/62 | HR 103 | Temp 98.9°F | Resp 16 | Ht 60.0 in | Wt 146.6 lb

## 2016-02-16 DIAGNOSIS — F900 Attention-deficit hyperactivity disorder, predominantly inattentive type: Secondary | ICD-10-CM

## 2016-02-16 DIAGNOSIS — F411 Generalized anxiety disorder: Secondary | ICD-10-CM

## 2016-02-16 DIAGNOSIS — B002 Herpesviral gingivostomatitis and pharyngotonsillitis: Secondary | ICD-10-CM | POA: Diagnosis not present

## 2016-02-16 MED ORDER — AMPHETAMINE-DEXTROAMPHETAMINE 15 MG PO TABS: 15.0000 mg | ORAL_TABLET | Freq: Every day | ORAL | 0 refills | Status: DC

## 2016-02-16 MED ORDER — AMPHETAMINE-DEXTROAMPHETAMINE 20 MG PO TABS: 20.0000 mg | ORAL_TABLET | Freq: Every day | ORAL | 0 refills | Status: DC

## 2016-02-16 MED ORDER — VALACYCLOVIR HCL 1 G PO TABS: 2000.0000 mg | ORAL_TABLET | Freq: Two times a day (BID) | ORAL | 2 refills | Status: DC

## 2016-02-16 MED ORDER — CLONAZEPAM 1 MG PO TABS: 1.0000 mg | ORAL_TABLET | Freq: Two times a day (BID) | ORAL | 2 refills | Status: DC | PRN

## 2016-02-16 NOTE — Progress Notes (Signed)
Name: Pamela Meyers   MRN: [id number]    DOB: 11/18/1985   Date:02/16/2016       Progress Note  Subjective  Chief Complaint  Chief Complaint  Patient presents with  . Follow-up    medication refills would like increase on Adderall  . Mouth Lesions    for 2 days on lips very painful    Mouth Lesions   The current episode started yesterday. The onset was gradual. The problem has been unchanged. Associated symptoms include mouth sores. Pertinent negatives include no fever, no abdominal pain, no nausea, no vomiting and no cough.    Attention Deficit Disorder: Pt. Presents for medication refill on Adderall, she has trouble focusing, especially with her kids and at work. She usually starts one task and goes to another before finishing that first task. She has been taking Adderall 15 mg twice a day. 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 She is requesting an increase in the PM dose.   Anxiety: Main symptoms include nervousness, stress, has had panic attacks, taking Clonazepam 1 mg twice daily as needed, works well, no side effects.  Past Medical History:  Diagnosis Date  . Anxiety   . Attention deficit disorder (ADD) without hyperactivity     Past Surgical History:  Procedure Laterality Date  . APPENDECTOMY    . CHOLECYSTECTOMY    . TUBAL LIGATION      No family history on file.  Social History   Social History  . Marital status: Married    Spouse name: N/A  . Number of children: N/A  . Years of education: N/A   Occupational History  . Not on file.   Social History Main Topics  . Smoking status: Current Every Day Smoker    Packs/day: 0.75    Years: 17.00    Types: Cigarettes  . Smokeless tobacco: Never Used  . Alcohol use No  . Drug use: No  . Sexual activity: Yes    Birth control/ protection: Surgical     Comment: last sex two days ago     Other Topics Concern  . Not on file   Social History Narrative  . No narrative on file     Current Outpatient Prescriptions:  .  acetaminophen (TYLENOL) 500 MG tablet, Take 500 mg by mouth every 6 (six) hours as needed for mild pain, moderate pain or headache. Reported on 06/23/2015, Disp: , Rfl:  .  clonazePAM (KLONOPIN) 1 MG tablet, Take 1 tablet (1 mg total) by mouth 2 (two) times daily as needed for anxiety., Disp: 60 tablet, Rfl: 0 .  norgestimate-ethinyl estradiol (ORTHO-CYCLEN,SPRINTEC,PREVIFEM) 0.25-35 MG-MCG tablet, Take 1 tablet by mouth daily., Disp: 1 Package, Rfl: 11 .  amphetamine-dextroamphetamine (ADDERALL) 15 MG tablet, Take 1 tablet by mouth 2 (two) times daily., Disp: 60 tablet, Rfl: 0  Allergies  Allergen Reactions  . Penicillins Hives  . Ultram [Tramadol] Nausea And Vomiting  . Morphine And Related Hives  . Codeine Palpitations     Review of Systems  Constitutional: Negative for chills, fever and malaise/fatigue.  HENT: Positive for mouth sores.   Respiratory: Negative for cough.   Cardiovascular: Negative for chest pain.  Gastrointestinal: Negative for abdominal pain, nausea and vomiting.  Psychiatric/Behavioral: The patient is not nervous/anxious and does not have insomnia.       Objective  Vitals:  02/16/16 1001  BP: 110/62  Pulse: (!) 103  Resp: 16  Temp: 98.9 F (37.2 C)  TempSrc: Oral  SpO2: 97%  Weight: 146 lb 9.6 oz (66.5 kg)  Height: 5' (1.524 m)    Physical Exam  Constitutional: She is oriented to person, place, and time and well-developed, well-nourished, and in no distress.  HENT:  Mouth/Throat:    Dryness and irritation on lower lip,  Painful ulcerated lesions on upper lip, c/w cold sores  Cardiovascular: Normal rate, regular rhythm, S1 normal and S2 normal.   No murmur heard. Pulmonary/Chest: Effort normal and breath sounds normal. No respiratory distress. She has no wheezes. She has no rales.  Neurological: She is  alert and oriented to person, place, and time.  Psychiatric: Mood, memory, affect and judgment normal.  Nursing note and vitals reviewed.     Recent Results (from the past 2160 hour(s))  POCT HgB A1C     Status: None   Collection Time: 11/18/15 10:22 AM  Result Value Ref Range   Hemoglobin A1C 5.6      Assessment & Plan  1. ADHD, predominantly inattentive type We'll increase Adderall to 20 mg in evening and 15 mg in the morning. Follow-up in 3 months - amphetamine-dextroamphetamine (ADDERALL) 15 MG tablet; Take 1 tablet by mouth daily.  Dispense: 30 tablet; Refill: 0 - amphetamine-dextroamphetamine (ADDERALL) 15 MG tablet; Take 1 tablet by mouth daily.  Dispense: 30 tablet; Refill: 0 - amphetamine-dextroamphetamine (ADDERALL) 15 MG tablet; Take 1 tablet by mouth daily.  Dispense: 30 tablet; Refill: 0 - amphetamine-dextroamphetamine (ADDERALL) 20 MG tablet; Take 1 tablet (20 mg total) by mouth daily.  Dispense: 30 tablet; Refill: 0 - amphetamine-dextroamphetamine (ADDERALL) 20 MG tablet; Take 1 tablet (20 mg total) by mouth daily. Please fill on/after February 3rd, 2018  Dispense: 30 tablet; Refill: 0 - amphetamine-dextroamphetamine (ADDERALL) 20 MG tablet; Take 1 tablet (20 mg total) by mouth daily.  Dispense: 30 tablet; Refill: 0  2. Generalized anxiety disorder  - clonazePAM (KLONOPIN) 1 MG tablet; Take 1 tablet (1 mg total) by mouth 2 (two) times daily as needed for anxiety.  Dispense: 60 tablet; Refill: 2  3. Oral herpes simplex infection  - valACYclovir (VALTREX) 1000 MG tablet; Take 2 tablets (2,000 mg total) by mouth 2 (two) times daily.  Dispense: 4 tablet; Refill: 2  Teondre Jarosz Asad A. Faylene [facility] 02/16/2016 10:19 AM

## 2016-03-09 ENCOUNTER — Other Ambulatory Visit: Payer: Self-pay | Admitting: Family Medicine

## 2016-03-09 DIAGNOSIS — B002 Herpesviral gingivostomatitis and pharyngotonsillitis: Secondary | ICD-10-CM

## 2016-03-09 MED ORDER — VALACYCLOVIR HCL 1 G PO TABS
2000.0000 mg | ORAL_TABLET | Freq: Two times a day (BID) | ORAL | 1 refills | Status: DC
Start: 2016-03-09 — End: 2023-12-07

## 2016-03-09 NOTE — Telephone Encounter (Signed)
Medication has been refilled and sent to [facility] [place]

## 2016-03-09 NOTE — Telephone Encounter (Signed)
Patient stated that she needs her remaining 2 refills of valtrex to be sent to the [facility] in [place].  [facility] [address] [facility] the prescription was originally sent and was told that they did not have it.  Please [facility] to the [facility] [place] and patient would like a call back once complete

## 2016-03-09 NOTE — Telephone Encounter (Signed)
Routed to Dr. Sherryll BurgerShah for approval; Patient stated that she needs her remaining 2 refills of valtrex to be sent to the [facility] in [place].  [facility] [address] [facility] the prescription was originally sent and was told that they did not have it.  Please [facility] to the [facility] [place] and patient would like a call back once complete

## 2016-03-16 ENCOUNTER — Telehealth: Payer: Self-pay

## 2016-03-16 NOTE — Telephone Encounter (Signed)
Patient called ans she is asking if she can pick up her Klonopin and Adderll today since she is going out of town. She stated she want be back in town until a week later but will need them. [facility] [place] [facility] is [phone].

## 2016-03-16 NOTE — Telephone Encounter (Signed)
Spoke with pharmacy and okayed an early refill on the above-mentioned prescriptions

## 2016-03-16 NOTE — Telephone Encounter (Signed)
Pt is asking for this to be rushed due to her going out of town today

## 2016-05-15 ENCOUNTER — Ambulatory Visit (INDEPENDENT_AMBULATORY_CARE_PROVIDER_SITE_OTHER): Payer: Medicaid Other | Admitting: Family Medicine

## 2016-05-15 ENCOUNTER — Encounter: Payer: Self-pay | Admitting: Family Medicine

## 2016-05-15 DIAGNOSIS — F900 Attention-deficit hyperactivity disorder, predominantly inattentive type: Secondary | ICD-10-CM

## 2016-05-15 DIAGNOSIS — F411 Generalized anxiety disorder: Secondary | ICD-10-CM | POA: Diagnosis not present

## 2016-05-15 MED ORDER — AMPHETAMINE-DEXTROAMPHETAMINE 15 MG PO TABS: 15.0000 mg | ORAL_TABLET | Freq: Every day | ORAL | 0 refills | Status: DC

## 2016-05-15 MED ORDER — CLONAZEPAM 1 MG PO TABS: 1.0000 mg | ORAL_TABLET | Freq: Two times a day (BID) | ORAL | 2 refills | Status: DC | PRN

## 2016-05-15 MED ORDER — AMPHETAMINE-DEXTROAMPHETAMINE 20 MG PO TABS: 20.0000 mg | ORAL_TABLET | Freq: Every day | ORAL | 0 refills | Status: DC

## 2016-05-15 NOTE — Progress Notes (Signed)
Name: Pamela Meyers   MRN: [id number]    DOB: [birth year]   Date:05/15/2016       Progress Note  Subjective  Chief Complaint  Chief Complaint  Patient presents with  . Follow-up    3 mo  . Medication Refill    HPI  Patient presents for follow-up of Attention Deficit Disorder, mood Main symptoms include difficulty completing one task before starting another and trouble with focusing. She has been taking Adderall 20 mg in the morning and 15 mg in the afternoon, this has helped with symptoms, no side effects reported.  She also has anxiety described as feeling nervous and stressed out, multiple responsibilities including work, home and children. Takes clonazepam 1 mg twice daily as needed, this seems to help with no side effects.  Past Medical History:  Diagnosis Date  . Anxiety   . Attention deficit disorder (ADD) without hyperactivity     Past Surgical History:  Procedure Laterality Date  . APPENDECTOMY    . CHOLECYSTECTOMY    . TUBAL LIGATION      History reviewed. No pertinent family history.  Social History   Social History  . Marital status: Married    Spouse name: N/A  . Number of children: N/A  . Years of education: N/A   Occupational History  . Not on file.   Social History Main Topics  . Smoking status: Current Every Day Smoker    Packs/day: 0.75    Years: 17.00    Types: Cigarettes  . Smokeless tobacco: Never Used  . Alcohol use No  . Drug use: No  . Sexual activity: Yes    Birth control/ protection: Surgical     Comment: last sex two days ago   Other Topics Concern  . Not on file   Social History Narrative  . No narrative on file     Current Outpatient Prescriptions:  .  acetaminophen (TYLENOL) 500 MG tablet, Take 500 mg by mouth every 6 (six) hours as needed for mild pain, moderate pain or headache. Reported on 06/23/2015, Disp: , Rfl:  .  amphetamine-dextroamphetamine (ADDERALL) 20 MG tablet, Take 1 tablet (20 mg total) by mouth daily., Disp:  30 tablet, Rfl: 0 .  amphetamine-dextroamphetamine (ADDERALL) 20 MG tablet, Take 1 tablet (20 mg total) by mouth daily. Please fill on/after February 3rd, 2018, Disp: 30 tablet, Rfl: 0 .  amphetamine-dextroamphetamine (ADDERALL) 20 MG tablet, Take 1 tablet (20 mg total) by mouth daily., Disp: 30 tablet, Rfl: 0 .  clonazePAM (KLONOPIN) 1 MG tablet, Take 1 tablet (1 mg total) by mouth 2 (two) times daily as needed for anxiety., Disp: 60 tablet, Rfl: 2 .  norgestimate-ethinyl estradiol (ORTHO-CYCLEN,SPRINTEC,PREVIFEM) 0.25-35 MG-MCG tablet, Take 1 tablet by mouth daily., Disp: 1 Package, Rfl: 11 .  valACYclovir (VALTREX) 1000 MG tablet, Take 2 tablets (2,000 mg total) by mouth 2 (two) times daily., Disp: 4 tablet, Rfl: 1 .  amphetamine-dextroamphetamine (ADDERALL) 15 MG tablet, Take 1 tablet by mouth daily., Disp: 30 tablet, Rfl: 0 .  amphetamine-dextroamphetamine (ADDERALL) 15 MG tablet, Take 1 tablet by mouth daily., Disp: 30 tablet, Rfl: 0 .  amphetamine-dextroamphetamine (ADDERALL) 15 MG tablet, Take 1 tablet by mouth daily., Disp: 30 tablet, Rfl: 0  Allergies  Allergen Reactions  . Penicillins Hives  . Ultram [Tramadol] Nausea And Vomiting  . Morphine And Related Hives  . Codeine Palpitations     ROS  Please see history of present illness for complete discussion of ROS  Objective  Vitals:  05/15/16 0819  BP: 108/79  Pulse: 85  Resp: 17  Temp: 98.1 F (36.7 C)  TempSrc: Oral  SpO2: 99%  Weight: 142 lb 11.2 oz (64.7 kg)  Height: 5' (1.524 m)    Physical Exam  Constitutional: She is oriented to person, place, and time and well-developed, well-nourished, and in no distress.  HENT:  Head: Normocephalic and atraumatic.  Cardiovascular: Normal rate and regular rhythm.   No murmur heard. Pulmonary/Chest: Effort normal and breath sounds normal. No respiratory distress. She has no wheezes.  Neurological: She is alert and oriented to person, place, and time.  Psychiatric: Mood,  memory, affect and judgment normal.  Nursing note and vitals reviewed.     Assessment & Plan  1. ADHD, predominantly inattentive type Stable, Adderall helps with symptoms of inattention, no reported adverse effects, refills provided - amphetamine-dextroamphetamine (ADDERALL) 15 MG tablet; Take 1 tablet by mouth daily.  Dispense: 30 tablet; Refill: 0 - amphetamine-dextroamphetamine (ADDERALL) 15 MG tablet; Take 1 tablet by mouth daily.  Dispense: 30 tablet; Refill: 0 - amphetamine-dextroamphetamine (ADDERALL) 15 MG tablet; Take 1 tablet by mouth daily.  Dispense: 30 tablet; Refill: 0 - amphetamine-dextroamphetamine (ADDERALL) 20 MG tablet; Take 1 tablet (20 mg total) by mouth daily.  Dispense: 30 tablet; Refill: 0 - amphetamine-dextroamphetamine (ADDERALL) 20 MG tablet; Take 1 tablet (20 mg total) by mouth daily.  Dispense: 30 tablet; Refill: 0 - amphetamine-dextroamphetamine (ADDERALL) 20 MG tablet; Take 1 tablet (20 mg total) by mouth daily.  Dispense: 30 tablet; Refill: 0  2. Generalized anxiety disorder  - clonazePAM (KLONOPIN) 1 MG tablet; Take 1 tablet (1 mg total) by mouth 2 (two) times daily as needed for anxiety.  Dispense: 60 tablet; Refill: 2   Zaccheus Edmister Asad A[facility] 05/15/2016 8:29 AM

## 2016-05-18 ENCOUNTER — Ambulatory Visit: Payer: Self-pay | Admitting: Family Medicine

## 2016-06-12 ENCOUNTER — Telehealth: Payer: Self-pay | Admitting: Family Medicine

## 2016-06-12 ENCOUNTER — Other Ambulatory Visit: Payer: Self-pay

## 2016-06-12 DIAGNOSIS — F900 Attention-deficit hyperactivity disorder, predominantly inattentive type: Secondary | ICD-10-CM

## 2016-06-12 MED ORDER — AMPHETAMINE-DEXTROAMPHETAMINE 15 MG PO TABS: 15.0000 mg | ORAL_TABLET | Freq: Every day | ORAL | 0 refills | Status: DC

## 2016-06-12 MED ORDER — AMPHETAMINE-DEXTROAMPHETAMINE 20 MG PO TABS: 20.0000 mg | ORAL_TABLET | Freq: Every day | ORAL | 0 refills | Status: DC

## 2016-06-12 NOTE — Telephone Encounter (Signed)
Pt checking status on prescription refill. She apologizes for the short notice but states that she has to leave by 2pm

## 2016-06-12 NOTE — Telephone Encounter (Signed)
Okay to authorize 1 time early refill, please print and will sign

## 2016-06-12 NOTE — Telephone Encounter (Signed)
done

## 2016-06-12 NOTE — Telephone Encounter (Signed)
Called pharmacy to authorize early refill. Per Dr. Sherryll Burger

## 2016-06-12 NOTE — Telephone Encounter (Signed)
Pt is leaving out of town today at lunch time to go to her sister to help with her children due to her having chemo treatment. She is asking that we authorize her refill on Adderall 15 & 20 mg (which can be filled on 06-14-16). She is asking that you send to [facility]. There number is [phone]

## 2016-06-27 ENCOUNTER — Telehealth: Payer: Self-pay | Admitting: Family Medicine

## 2016-06-27 ENCOUNTER — Ambulatory Visit: Payer: Self-pay | Admitting: Family Medicine

## 2016-06-27 NOTE — Telephone Encounter (Signed)
Pt would like a call back

## 2016-06-28 ENCOUNTER — Telehealth: Payer: Self-pay | Admitting: Family Medicine

## 2016-06-28 NOTE — Telephone Encounter (Signed)
Would like to speak you pertaining to her medication. [phone]

## 2016-06-28 NOTE — Telephone Encounter (Signed)
Left voicemail asking patient to return call.

## 2016-06-28 NOTE — Telephone Encounter (Signed)
Returned patient call left voicemail asking patient to call back

## 2016-06-29 ENCOUNTER — Ambulatory Visit: Payer: Self-pay | Admitting: Family Medicine

## 2016-06-29 NOTE — Telephone Encounter (Signed)
Pt requesting return call [phone]

## 2016-06-29 NOTE — Telephone Encounter (Signed)
Spoke with patient and she has scheduled appointment for 07/03/2016 for medication problem

## 2016-07-03 ENCOUNTER — Ambulatory Visit: Payer: Self-pay | Admitting: Family Medicine

## 2016-08-11 ENCOUNTER — Encounter: Payer: Self-pay | Admitting: Family Medicine

## 2016-08-11 ENCOUNTER — Ambulatory Visit (INDEPENDENT_AMBULATORY_CARE_PROVIDER_SITE_OTHER): Payer: Medicaid Other | Admitting: Family Medicine

## 2016-08-11 VITALS — BP 110/69 | HR 126 | Temp 98.9°F | Resp 17 | Ht 60.0 in | Wt 146.5 lb

## 2016-08-11 DIAGNOSIS — F900 Attention-deficit hyperactivity disorder, predominantly inattentive type: Secondary | ICD-10-CM

## 2016-08-11 DIAGNOSIS — F411 Generalized anxiety disorder: Secondary | ICD-10-CM | POA: Diagnosis not present

## 2016-08-11 MED ORDER — AMPHETAMINE-DEXTROAMPHETAMINE 20 MG PO TABS: 20.0000 mg | ORAL_TABLET | Freq: Two times a day (BID) | ORAL | 0 refills | Status: DC

## 2016-08-11 MED ORDER — CLONAZEPAM 1 MG PO TABS: 1.0000 mg | ORAL_TABLET | Freq: Two times a day (BID) | ORAL | 2 refills | Status: DC | PRN

## 2016-08-11 NOTE — Progress Notes (Signed)
Name: Pamela Meyers   MRN: [id number]    DOB: 01/20/1986   Date:08/11/2016       Progress Note  Subjective  Chief Complaint  Chief Complaint  Patient presents with  . Follow-up    3 mo  . Medication Refill    HPI  Attention Deficit Disorder: Patient presents for Attention Deficit Disorder, she has trouble focusing, sustaining attention, and getting easily distracted. She takes Adderall 20 mg in AM and 15 mg in PM, she feels that the 15 mg dose is inadequate, wears off quickly and she starts having the symptoms of inattention again, especially at work. She is requesting the medication be changed to 20 mg in afternoon. She has no side effects from Adderall. she is also requesting a refill to be authorized a day or 2 earlier than usual because [place] and will be there for a week.c  Anxiety: Pt. Presents for medication refills and follow up on anxiety. She has generalized anxiety, symptoms include nervousness, anxious, stressed at home situation (she is taking care of her mother in law who recently had surgery). She takes Clonazepam 1 mg twice daily as needed, which appears to be working well.      Past Medical History:  Diagnosis Date  . Anxiety   . Attention deficit disorder (ADD) without hyperactivity     Past Surgical History:  Procedure Laterality Date  . APPENDECTOMY    . CHOLECYSTECTOMY    . TUBAL LIGATION      History reviewed. No pertinent family history.  Social History   Social History  . Marital status: Married    Spouse name: N/A  . Number of children: N/A  . Years of education: N/A   Occupational History  . Not on file.   Social History Main Topics  . Smoking status: Current Every Day Smoker    Packs/day: 0.75    Years: 17.00    Types: Cigarettes  . Smokeless tobacco: Never Used  . Alcohol use No  . Drug use: No  . Sexual activity: Yes    Birth control/ protection: Surgical     Comment: last sex two  days ago   Other Topics Concern  . Not on file   Social History Narrative  . No narrative on file     Current Outpatient Prescriptions:  .  acetaminophen (TYLENOL) 500 MG tablet, Take 500 mg by mouth every 6 (six) hours as needed for mild pain, moderate pain or headache. Reported on 06/23/2015, Disp: , Rfl:  .  amphetamine-dextroamphetamine (ADDERALL) 20 MG tablet, Take 1 tablet (20 mg total) by mouth daily., Disp: 30 tablet, Rfl: 0 .  amphetamine-dextroamphetamine (ADDERALL) 20 MG tablet, Take 1 tablet (20 mg total) by mouth daily., Disp: 30 tablet, Rfl: 0 .  clonazePAM (KLONOPIN) 1 MG tablet, Take 1 tablet (1 mg total) by mouth 2 (two) times daily as needed for anxiety., Disp: 60 tablet, Rfl: 2 .  norgestimate-ethinyl estradiol (ORTHO-CYCLEN,SPRINTEC,PREVIFEM) 0.25-35 MG-MCG tablet, Take 1 tablet by mouth daily., Disp: 1 Package, Rfl: 11 .  valACYclovir (VALTREX) 1000 MG tablet, Take 2 tablets (2,000 mg total) by mouth 2 (two) times daily., Disp: 4 tablet, Rfl: 1 .  amphetamine-dextroamphetamine (ADDERALL) 15 MG tablet, Take 1 tablet by mouth daily., Disp: 30 tablet, Rfl: 0 .  amphetamine-dextroamphetamine (ADDERALL) 15 MG tablet, Take 1 tablet by mouth daily., Disp: 30 tablet, Rfl: 0  Allergies  Allergen Reactions  . Penicillins Hives  .  Ultram [Tramadol] Nausea And Vomiting  . Morphine And Related Hives  . Codeine Palpitations     ROS  Please see history of present illness for complete discussion of ROS  Objective  Vitals:   08/11/16 0940  BP: 110/69  Pulse: (!) 126  Resp: 17  Temp: 98.9 F (37.2 C)  TempSrc: Oral  SpO2: 98%  Weight: 146 lb 8 oz (66.5 kg)  Height: 5' (1.524 m)    Physical Exam  Constitutional: She is oriented to person, place, and time and well-developed, well-nourished, and in no distress.  HENT:  Head: Normocephalic and atraumatic.  Cardiovascular: Normal rate, regular rhythm and normal heart sounds.   No murmur heard. Pulmonary/Chest:  Effort normal and breath sounds normal. She has no wheezes.  Neurological: She is alert and oriented to person, place, and time.  Psychiatric: Mood, memory, affect and judgment normal.  Nursing note and vitals reviewed.      Assessment & Plan  1. Generalized anxiety disorder Stable and responsive to clonazepam taken twice daily as needed, refills provided - clonazePAM (KLONOPIN) 1 MG tablet; Take 1 tablet (1 mg total) by mouth 2 (two) times daily as needed for anxiety.  Dispense: 60 tablet; Refill: 2  2. ADHD, predominantly inattentive type  [place] was performed, authorize early refill for Adderall, increase to 20 mg twice a day with the second dose to be taken early afternoon. Reassess in 2 months.  Amphetamine-dextroamphetamine (ADDERALL) 20 MG tablet; Take 1 tablet (20 mg total) by mouth 2 (two) times daily.  Dispense: 60 tablet; Refill: 0 - amphetamine-dextroamphetamine (ADDERALL) 20 MG tablet; Take 1 tablet (20 mg total) by mouth 2 (two) times daily.  Dispense: 60 tablet; Refill: 0   Susanna Benge Asad A. Faylene KurtzShah [facility] 08/11/2016 9:54 AM

## 2016-10-11 ENCOUNTER — Ambulatory Visit: Payer: Self-pay | Admitting: Family Medicine

## 2016-11-27 IMAGING — CR DG FOREARM 2V*R*
2 series · 2 of 2 positions shown · non-contrast
Comparison: None.

CLINICAL DATA: Pt states that she tripped over dog's leash in
laundry [HOSPITAL] hours ago, attempted to brace for fall with her rt
forearm hitting the edge of the door, pt has bruising, swelling, and
pain to the forearm area with decreased sensation to her finger tips
but able to move fingers.

EXAM:
RIGHT FOREARM - 2 VIEW

[forearm ap]
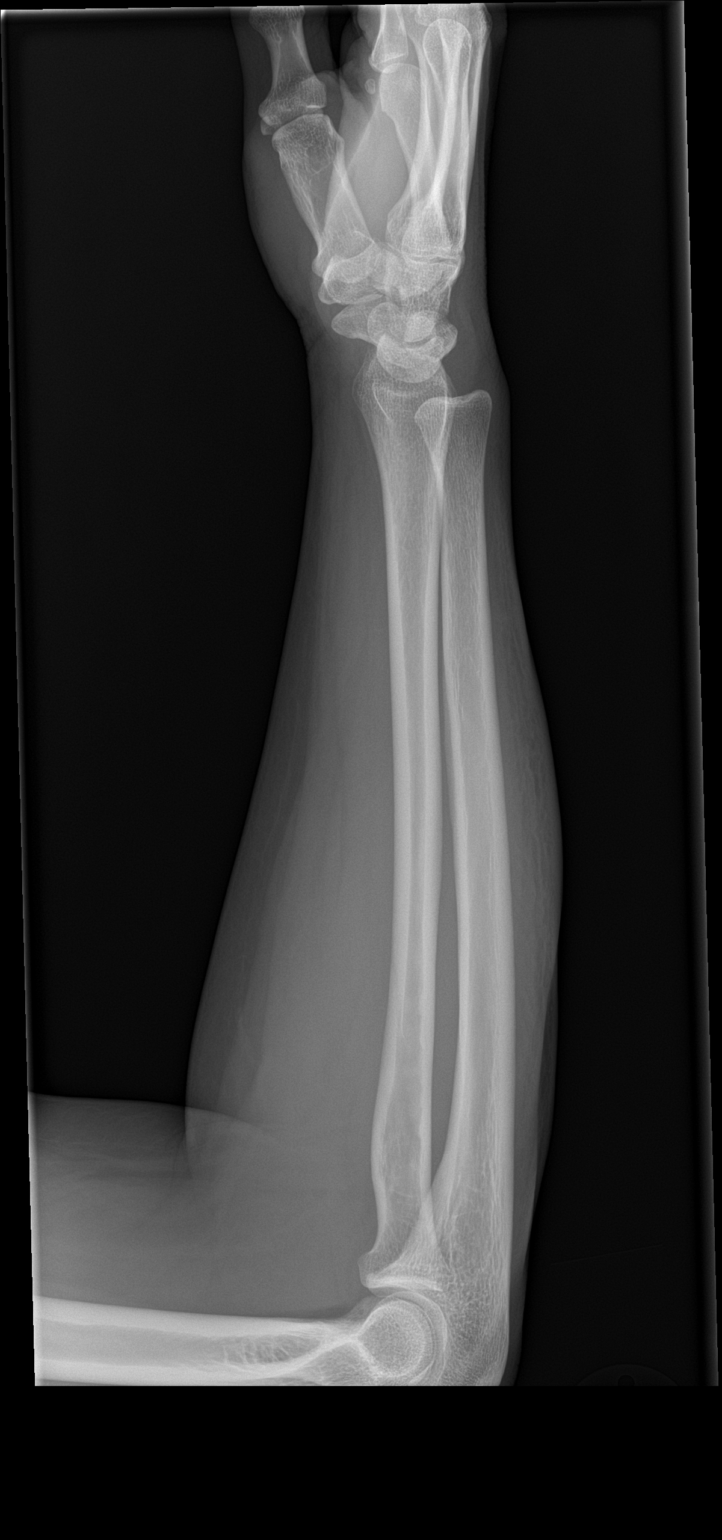

[forearm lat]
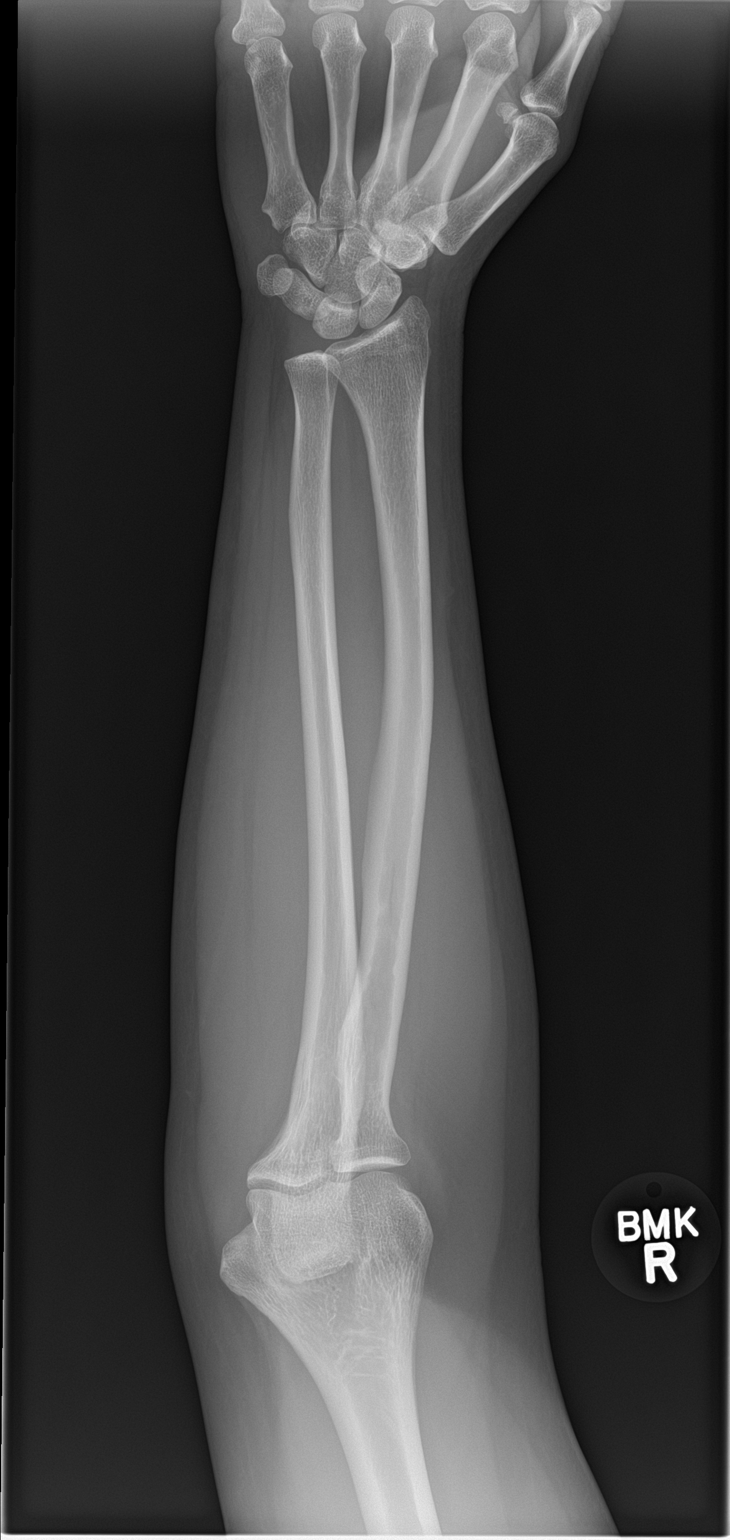

[2 of 2 positions shown; findings below may reference images not displayed]

FINDINGS: No fracture. Wrist and elbow joints are normally spaced and aligned.
There is dorsal soft tissue edema.
IMPRESSION: No fracture or dislocation.

## 2017-01-15 ENCOUNTER — Encounter: Payer: Self-pay | Admitting: Family Medicine

## 2017-01-15 ENCOUNTER — Ambulatory Visit: Payer: Medicaid Other | Admitting: Family Medicine

## 2017-01-15 VITALS — BP 114/78 | HR 100 | Temp 98.5°F | Resp 16 | Wt 155.4 lb

## 2017-01-15 DIAGNOSIS — F411 Generalized anxiety disorder: Secondary | ICD-10-CM | POA: Diagnosis not present

## 2017-01-15 DIAGNOSIS — N946 Dysmenorrhea, unspecified: Secondary | ICD-10-CM | POA: Diagnosis not present

## 2017-01-15 DIAGNOSIS — F900 Attention-deficit hyperactivity disorder, predominantly inattentive type: Secondary | ICD-10-CM

## 2017-01-15 MED ORDER — CLONAZEPAM 1 MG PO TABS: 1.0000 mg | ORAL_TABLET | Freq: Two times a day (BID) | ORAL | 0 refills | Status: DC | PRN

## 2017-01-15 MED ORDER — AMPHETAMINE-DEXTROAMPHETAMINE 20 MG PO TABS: 20.0000 mg | ORAL_TABLET | ORAL | 0 refills | Status: DC

## 2017-01-15 NOTE — Progress Notes (Signed)
Name: Pamela Meyers   MRN: [id number]    DOB: 1985/03/04   Date:01/15/2017       Progress Note  Subjective  Chief Complaint  Chief Complaint  Patient presents with  . medication mangement    Pt asking for increase of clonzepam due to stresful situations  . Medication Refill    Addreall and clonzepam  . Referral    [facility]  . itchy scalp    HPI  Attention Deficit Disorder: Pt. Presents for evaluation of Attention Deficit Disorder, symptoms include difficulty focusing, not finishing tasks on time, and being inattentive. She has not been on her medications for last 3 months while incarcerated, now released and wishing to start back on her medication as before.    Anxiety: She has been having increased anxiety, described as feeling worried, anxious, having panic attacks etc. She was recently released from prison, was not able to get her medications during that time.  She is asking for increasing the dose of clonazepam because of recent stressful situation and wants to take an afternoon dosage of clonazepam, she normally takes clonazepam in the morning and at night   Past Medical History:  Diagnosis Date  . Anxiety   . Attention deficit disorder (ADD) without hyperactivity     Past Surgical History:  Procedure Laterality Date  . APPENDECTOMY    . CHOLECYSTECTOMY    . TUBAL LIGATION      No family history on file.  Social History   Socioeconomic History  . Marital status: Married    Spouse name: Not on file  . Number of children: Not on file  . Years of education: Not on file  . Highest education level: Not on file  Social Needs  . Financial resource strain: Not on file  . Food insecurity - worry: Not on file  . Food insecurity - inability: Not on file  . Transportation needs - medical: Not on file  . Transportation needs - non-medical: Not on file  Occupational History  . Not on file  Tobacco Use  . Smoking status: Former Smoker   Packs/day: 0.75    Years: 17.00    Pack years: 12.75    Types: Cigarettes  . Smokeless tobacco: Never Used  Substance and Sexual Activity  . Alcohol use: No  . Drug use: No  . Sexual activity: Yes    Birth control/protection: Surgical    Comment: last sex two days ago  Other Topics Concern  . Not on file  Social History Narrative  . Not on file     Current Outpatient Medications:  .  acetaminophen (TYLENOL) 500 MG tablet, Take 500 mg by mouth every 6 (six) hours as needed for mild pain, moderate pain or headache. Reported on 06/23/2015, Disp: , Rfl:  .  amphetamine-dextroamphetamine (ADDERALL) 20 MG tablet, Take 1 tablet (20 mg total) by mouth 2 (two) times daily., Disp: 60 tablet, Rfl: 0 .  amphetamine-dextroamphetamine (ADDERALL) 20 MG tablet, Take 1 tablet (20 mg total) by mouth 2 (two) times daily., Disp: 60 tablet, Rfl: 0 .  clonazePAM (KLONOPIN) 1 MG tablet, Take 1 tablet (1 mg total) by mouth 2 (two) times daily as needed for anxiety., Disp: 60 tablet, Rfl: 2 .  norgestimate-ethinyl estradiol (ORTHO-CYCLEN,SPRINTEC,PREVIFEM) 0.25-35 MG-MCG tablet, Take 1 tablet by mouth daily., Disp: 1 Package, Rfl: 11 .  valACYclovir (VALTREX) 1000 MG tablet, Take 2 tablets (2,000 mg total) by mouth 2 (two) times daily., Disp: 4 tablet, Rfl: 1 .  amphetamine-dextroamphetamine (ADDERALL) 15 MG tablet, Take 1 tablet by mouth daily., Disp: 30 tablet, Rfl: 0 .  amphetamine-dextroamphetamine (ADDERALL) 15 MG tablet, Take 1 tablet by mouth daily., Disp: 30 tablet, Rfl: 0  Allergies  Allergen Reactions  . Penicillins Hives  . Ultram [Tramadol] Nausea And Vomiting  . Morphine And Related Hives  . Codeine Palpitations     ROS    Objective  Vitals:   01/15/17 0845  BP: 114/78  Pulse: 100  Resp: 16  Temp: 98.5 F (36.9 C)  TempSrc: Oral  SpO2: 99%  Weight: 155 lb 6.4 oz (70.5 kg)    Physical Exam  Constitutional: She is oriented to person, place, and time and well-developed,  well-nourished, and in no distress.  HENT:  Head: Normocephalic and atraumatic.  Cardiovascular: Normal rate, regular rhythm and normal heart sounds.  No murmur heard. Pulmonary/Chest: Effort normal and breath sounds normal. She has no wheezes.  Neurological: She is alert and oriented to person, place, and time.  Psychiatric: Mood, memory, affect and judgment normal.  Nursing note and vitals reviewed.    Assessment & Plan  1. Generalized anxiety disorder Recommended that she should break 1 tablet in half and take half tablet in morning and half in the afternoon and one full tablet at night as needed, reviewed and noted controlled substances Registry system prior to prescription for controlled substances. Advised of my pending departure from the practice and the need to see a psychiatrist for management of anxiety. - clonazePAM (KLONOPIN) 1 MG tablet; Take 1 tablet (1 mg total) by mouth 2 (two) times daily as needed for anxiety.  Dispense: 60 tablet; Refill: 0  2. ADHD, predominantly inattentive type Will restart on Adderall at 20 mg every morning, educated on potential side effects, refill divided - amphetamine-dextroamphetamine (ADDERALL) 20 MG tablet; Take 1 tablet (20 mg total) by mouth every morning.  Dispense: 30 tablet; Refill: 0  3. Dysmenorrhea Patient has history of painful menstrual cycle, we'll refer back to gynecology for management - Ambulatory referral to Gynecology    [facility] Asad A. Faylene KurtzSh[facility] 01/15/2017 9:01 AM

## 2017-01-26 ENCOUNTER — Telehealth: Payer: Self-pay | Admitting: Obstetrics & Gynecology

## 2017-01-26 NOTE — Telephone Encounter (Signed)
[facility] referring patient for Dysmenorrhea. Called and left message for patient to call back to be schedule

## 2017-01-30 NOTE — Telephone Encounter (Signed)
Called and left message for patient to call back to be schedule °

## 2017-01-31 NOTE — Telephone Encounter (Signed)
Called and left message for patient to call back to be schedule

## 2017-02-08 ENCOUNTER — Telehealth: Payer: Self-pay

## 2017-02-08 NOTE — Telephone Encounter (Signed)
Pt states in order for her to return back to work she will need an office visit. I suggest to see Pamela Meyers due to Pamela Meyers having more an open schedule. Pt stated she would have to get a ride and she will call us back.

## 2017-02-08 NOTE — Telephone Encounter (Signed)
Copied from CRM [phone]. Topic: Inquiry >> Feb 08, 2017 11:03 AM Alexander BergeronBarksdale, Harvey B wrote: Reason for CRM: pt called to see if she could get a doctors note (even though she hasnt been seen) b/c she took her son to the hospital for the flu and has gotten it but wanted to take a chance to see if she could get a note to return to work, contactd pt to advise

## 2017-02-12 NOTE — Telephone Encounter (Signed)
Sent email to primary care provider unable to reach patient to schedule

## 2017-02-15 ENCOUNTER — Ambulatory Visit: Payer: Self-pay | Admitting: Family Medicine

## 2017-02-22 ENCOUNTER — Encounter: Payer: Self-pay | Admitting: Family Medicine

## 2017-02-22 ENCOUNTER — Ambulatory Visit (INDEPENDENT_AMBULATORY_CARE_PROVIDER_SITE_OTHER): Payer: Medicaid Other | Admitting: Family Medicine

## 2017-02-22 DIAGNOSIS — F411 Generalized anxiety disorder: Secondary | ICD-10-CM

## 2017-02-22 DIAGNOSIS — F900 Attention-deficit hyperactivity disorder, predominantly inattentive type: Secondary | ICD-10-CM

## 2017-02-22 MED ORDER — AMPHETAMINE-DEXTROAMPHETAMINE 10 MG PO TABS: 10.0000 mg | ORAL_TABLET | Freq: Every day | ORAL | 0 refills | Status: DC

## 2017-02-22 MED ORDER — AMPHETAMINE-DEXTROAMPHETAMINE 20 MG PO TABS: 20.0000 mg | ORAL_TABLET | ORAL | 0 refills | Status: DC

## 2017-02-22 MED ORDER — CLONAZEPAM 1 MG PO TABS: 1.0000 mg | ORAL_TABLET | Freq: Two times a day (BID) | ORAL | 0 refills | Status: DC | PRN

## 2017-02-22 NOTE — Progress Notes (Signed)
Name: Pamela Meyers   MRN: [id number]    DOB: 1986-01-06   Date:02/22/2017       Progress Note  Subjective  Chief Complaint  Chief Complaint  Patient presents with  . Medication Refill  . ADHD    Doing 1 tablet of her Adderall 20 mg just in the afternoon to help her get through the day   . Anxiety    Stable    Anxiety  Presents for follow-up visit. Patient reports no depressed mood, excessive worry, insomnia, irritability, nervous/anxious behavior or panic. Primary symptoms comment: as long as she is on Klonopin, she is able to go through the day without problem, helps her sleep fine as well.. The severity of symptoms is moderate. The quality of sleep is good.    Attention Deficit Disorder: Pt. has Attention Deficit Disorder described as difficulty focusing, starting another task before finishing the first one, difficulty with concentration. She takes Adderall 20 mg in AM notices the medication wearing off around afternoon 12-1 PM, she is otherwise feeling well, the morning dose helps relieve her symptoms mentioned above.     Past Medical History:  Diagnosis Date  . Anxiety   . Attention deficit disorder (ADD) without hyperactivity     Past Surgical History:  Procedure Laterality Date  . APPENDECTOMY    . CHOLECYSTECTOMY    . TUBAL LIGATION      No family history on file.  Social History   Socioeconomic History  . Marital status: Married    Spouse name: Not on file  . Number of children: Not on file  . Years of education: Not on file  . Highest education level: Not on file  Social Needs  . Financial resource strain: Not on file  . Food insecurity - worry: Not on file  . Food insecurity - inability: Not on file  . Transportation needs - medical: Not on file  . Transportation needs - non-medical: Not on file  Occupational History  . Not on file  Tobacco Use  . Smoking status: Former Smoker    Packs/day: 0.75    Years: 17.00    Pack years: 12.75    Types:  Cigarettes  . Smokeless tobacco: Never Used  Substance and Sexual Activity  . Alcohol use: No  . Drug use: No  . Sexual activity: Yes    Birth control/protection: Surgical    Comment: last sex two days ago  Other Topics Concern  . Not on file  Social History Narrative  . Not on file     Current Outpatient Medications:  .  acetaminophen (TYLENOL) 500 MG tablet, Take 500 mg by mouth every 6 (six) hours as needed for mild pain, moderate pain or headache. Reported on 06/23/2015, Disp: , Rfl:  .  amphetamine-dextroamphetamine (ADDERALL) 20 MG tablet, Take 1 tablet (20 mg total) by mouth every morning., Disp: 30 tablet, Rfl: 0 .  clonazePAM (KLONOPIN) 1 MG tablet, Take 1 tablet (1 mg total) by mouth 2 (two) times daily as needed for anxiety., Disp: 60 tablet, Rfl: 0 .  valACYclovir (VALTREX) 1000 MG tablet, Take 2 tablets (2,000 mg total) by mouth 2 (two) times daily., Disp: 4 tablet, Rfl: 1 .  norgestimate-ethinyl estradiol (ORTHO-CYCLEN,SPRINTEC,PREVIFEM) 0.25-35 MG-MCG tablet, Take 1 tablet by mouth daily. (Patient not taking: Reported on 02/22/2017), Disp: 1 Package, Rfl: 11  Allergies  Allergen Reactions  . Penicillins Hives  . Ultram [Tramadol] Nausea And Vomiting  . Morphine And Related Hives  . Codeine Palpitations  Review of Systems  Constitutional: Negative for irritability.  Psychiatric/Behavioral: The patient is not nervous/anxious and does not have insomnia.       Objective  Vitals:   02/22/17 1017  BP: 122/70  Pulse: (!) 110  Resp: 18  Temp: 97.8 F (36.6 C)  TempSrc: Oral  SpO2: 97%  Weight: 149 lb (67.6 kg)  Height: 5' (1.524 m)    Physical Exam  Constitutional: She is oriented to person, place, and time and well-developed, well-nourished, and in no distress.  Cardiovascular: Normal rate, regular rhythm and normal heart sounds.  No murmur heard. Pulmonary/Chest: Effort normal and breath sounds normal. She has no wheezes.  Abdominal: Soft. Bowel  sounds are normal. There is no tenderness.  Neurological: She is alert and oriented to person, place, and time.  Psychiatric: Mood, memory, affect and judgment normal.  Nursing note and vitals reviewed.       Assessment & Plan  1. ADHD, predominantly inattentive type ADHD not under optimal control based on patient's symptoms and presentation, will add 10 mg to be taken every afternoon, patient aware of potential adverse effects with stimulants - amphetamine-dextroamphetamine (ADDERALL) 20 MG tablet; Take 1 tablet (20 mg total) by mouth every morning.  Dispense: 30 tablet; Refill: 0 - amphetamine-dextroamphetamine (ADDERALL) 10 MG tablet; Take 1 tablet (10 mg total) by mouth daily at 12 noon.  Dispense: 30 tablet; Refill: 0  2. Generalized anxiety disorder Symptoms of anxiety are stable and responsive to clonazepam taken twice daily as needed - clonazePAM (KLONOPIN) 1 MG tablet; Take 1 tablet (1 mg total) by mouth 2 (two) times daily as needed for anxiety.  Dispense: 60 tablet; Refill: 0   Angles Trevizo Asad A. Faylene KurtzShah [facility] 02/22/2017 10:26 AM

## 2017-02-28 ENCOUNTER — Ambulatory Visit: Payer: Self-pay | Admitting: Obstetrics & Gynecology

## 2017-03-23 ENCOUNTER — Ambulatory Visit: Payer: Medicaid Other | Admitting: Family Medicine

## 2017-03-23 ENCOUNTER — Encounter: Payer: Self-pay | Admitting: Family Medicine

## 2017-03-23 VITALS — BP 122/84 | HR 97 | Temp 99.7°F | Resp 16 | Ht 60.0 in | Wt 148.2 lb

## 2017-03-23 DIAGNOSIS — F411 Generalized anxiety disorder: Secondary | ICD-10-CM

## 2017-03-23 DIAGNOSIS — F900 Attention-deficit hyperactivity disorder, predominantly inattentive type: Secondary | ICD-10-CM | POA: Diagnosis not present

## 2017-03-23 DIAGNOSIS — L245 Irritant contact dermatitis due to other chemical products: Secondary | ICD-10-CM | POA: Diagnosis not present

## 2017-03-23 MED ORDER — MOMETASONE FUROATE 0.1 % EX OINT: TOPICAL_OINTMENT | Freq: Every day | CUTANEOUS | 0 refills | Status: AC

## 2017-03-23 MED ORDER — CLONAZEPAM 1 MG PO TABS: 1.0000 mg | ORAL_TABLET | Freq: Two times a day (BID) | ORAL | 0 refills | Status: DC | PRN

## 2017-03-23 MED ORDER — AMPHETAMINE-DEXTROAMPHETAMINE 20 MG PO TABS: 20.0000 mg | ORAL_TABLET | ORAL | 0 refills | Status: DC

## 2017-03-23 MED ORDER — AMPHETAMINE-DEXTROAMPHETAMINE 10 MG PO TABS: 10.0000 mg | ORAL_TABLET | Freq: Every day | ORAL | 0 refills | Status: DC

## 2017-03-23 NOTE — Progress Notes (Signed)
Name: Pamela Meyers   MRN: [id number]    DOB: 11-28-1985   Date:03/23/2017       Progress Note  Subjective  Chief Complaint  Chief Complaint  Patient presents with  . Follow-up  . Medication Refill  . Leg Swelling    Both legs around the ankle, rash around ankles worst on the left leg onset monday.  Paim occurs after working. Pt is aon her feet a lot due to work     Rash  This is a new problem. The current episode started in the past 7 days (2-3 days ago). The affected locations include the left lower leg. The rash is characterized by redness, swelling and itchiness. She was exposed to a new detergent/soap (has switched dryer fabric in her dryer.). Past treatments include nothing.  Anxiety  Presents for follow-up visit. Symptoms include depressed mood, excessive worry, irritability, nervous/anxious behavior and panic. Patient reports no insomnia or restlessness. Primary symptoms comment: getting stressed out easily. The severity of symptoms is moderate and causing significant distress.     Attention Deficit Disorder: Pt. Presents for evaluation of Attention Deficit Disorder, symptoms include inattention, difficulty completing tasks, leaving one task and starting another, symptoms present in both home and work setting and cause considerable distress. She has been on Adderall 20 mg in AM, 10 mg at noon, this combination seems to work well, no side e ffects reported.     Past Medical History:  Diagnosis Date  . Anxiety   . Attention deficit disorder (ADD) without hyperactivity     Past Surgical History:  Procedure Laterality Date  . APPENDECTOMY    . CHOLECYSTECTOMY    . TUBAL LIGATION      Family History  Problem Relation Age of Onset  . Non-Hodgkin's lymphoma Father   . ADD / ADHD Sister   . Heart murmur Maternal Aunt        bypass  . Anxiety disorder Maternal Aunt   . Atrial fibrillation Maternal Grandmother   . Diabetes Maternal Grandmother   . Heart disease Maternal  Grandmother   . Parkinson's disease Maternal Grandfather     Social History   Socioeconomic History  . Marital status: Married    Spouse name: Not on file  . Number of children: Not on file  . Years of education: Not on file  . Highest education level: Not on file  Social Needs  . Financial resource strain: Not on file  . Food insecurity - worry: Not on file  . Food insecurity - inability: Not on file  . Transportation needs - medical: Not on file  . Transportation needs - non-medical: Not on file  Occupational History  . Not on file  Tobacco Use  . Smoking status: Current Some Day Smoker    Packs/day: 0.75    Years: 17.00    Pack years: 12.75    Types: Cigarettes  . Smokeless tobacco: Never Used  Substance and Sexual Activity  . Alcohol use: No  . Drug use: No  . Sexual activity: Yes    Birth control/protection: Surgical    Comment: last sex two days ago  Other Topics Concern  . Not on file  Social History Narrative  . Not on file     Current Outpatient Medications:  .  acetaminophen (TYLENOL) 500 MG tablet, Take 500 mg by mouth every 6 (six) hours as needed for mild pain, moderate pain or headache. Reported on 06/23/2015, Disp: , Rfl:  .  amphetamine-dextroamphetamine (ADDERALL)  10 MG tablet, Take 1 tablet (10 mg total) by mouth daily at 12 noon., Disp: 30 tablet, Rfl: 0 .  amphetamine-dextroamphetamine (ADDERALL) 20 MG tablet, Take 1 tablet (20 mg total) by mouth every morning., Disp: 30 tablet, Rfl: 0 .  clonazePAM (KLONOPIN) 1 MG tablet, Take 1 tablet (1 mg total) by mouth 2 (two) times daily as needed for anxiety., Disp: 60 tablet, Rfl: 0 .  valACYclovir (VALTREX) 1000 MG tablet, Take 2 tablets (2,000 mg total) by mouth 2 (two) times daily., Disp: 4 tablet, Rfl: 1 .  norgestimate-ethinyl estradiol (ORTHO-CYCLEN,SPRINTEC,PREVIFEM) 0.25-35 MG-MCG tablet, Take 1 tablet by mouth daily. (Patient not taking: Reported on 02/22/2017), Disp: 1 Package, Rfl: 11  Allergies    Allergen Reactions  . Penicillins Hives  . Ultram [Tramadol] Nausea And Vomiting  . Morphine And Related Hives  . Codeine Palpitations     Review of Systems  Constitutional: Positive for irritability.  Skin: Positive for rash.  Psychiatric/Behavioral: The patient is nervous/anxious. The patient does not have insomnia.     Objective  Vitals:   03/23/17 1115  BP: 122/84  Pulse: 97  Resp: 16  Temp: 99.7 F (37.6 C)  TempSrc: Oral  SpO2: 95%  Weight: 148 lb 3.2 oz (67.2 kg)  Height: 5' (1.524 m)    Physical Exam  Constitutional: She is well-developed, well-nourished, and in no distress.  Cardiovascular: Regular rhythm, S1 normal, S2 normal and normal heart sounds. Tachycardia present.  No murmur heard. Pulmonary/Chest: Effort normal and breath sounds normal. She has no wheezes. She has no rhonchi.  Abdominal: Soft. Bowel sounds are normal.  Musculoskeletal:       Legs: Erythematous pruritic macular rash on the left lower medial leg.   Nursing note and vitals reviewed.      Assessment & Plan  1. ADHD, predominantly inattentive type Symptoms of attention deficit disorder are stable on Adderall taken twice daily, refills provided - amphetamine-dextroamphetamine (ADDERALL) 10 MG tablet; Take 1 tablet (10 mg total) by mouth daily at 12 noon.  Dispense: 30 tablet; Refill: 0 - amphetamine-dextroamphetamine (ADDERALL) 20 MG tablet; Take 1 tablet (20 mg total) by mouth every morning.  Dispense: 30 tablet; Refill: 0  2. Generalized anxiety disorder Symptoms of anxiety are stable on clonazepam taken up to twice daily as needed, will refer to psychiatry for further management - clonazePAM (KLONOPIN) 1 MG tablet; Take 1 tablet (1 mg total) by mouth 2 (two) times daily as needed for anxiety.  Dispense: 60 tablet; Refill: 0 - Ambulatory referral to Psychiatry  3. Irritant contact dermatitis due to chemical Advised to stop using the new fabric softener, apply topical steroids  for relief - mometasone (ELOCON) 0.1 % ointment; Apply topically daily for 5 days.  Dispense: 45 g; Refill: 0   Trishna Cwik Asad A. Faylene KurtzShah [facility] 03/23/2017 11:41 AM

## 2017-04-20 ENCOUNTER — Ambulatory Visit: Payer: Self-pay | Admitting: Family Medicine

## 2019-01-15 ENCOUNTER — Emergency Department
Admission: EM | Admit: 2019-01-15 | Discharge: 2019-01-15 | Disposition: A | Discharge status: Home / Self Care | Payer: Medicaid Other | Attending: Emergency Medicine | Admitting: Emergency Medicine

## 2019-01-15 ENCOUNTER — Other Ambulatory Visit: Payer: Self-pay

## 2019-01-15 ENCOUNTER — Encounter: Payer: Self-pay | Admitting: Emergency Medicine

## 2019-01-15 DIAGNOSIS — Y929 Unspecified place or not applicable: Secondary | ICD-10-CM | POA: Diagnosis not present

## 2019-01-15 DIAGNOSIS — Y281XXA Contact with knife, undetermined intent, initial encounter: Secondary | ICD-10-CM | POA: Insufficient documentation

## 2019-01-15 DIAGNOSIS — F17210 Nicotine dependence, cigarettes, uncomplicated: Secondary | ICD-10-CM | POA: Diagnosis not present

## 2019-01-15 DIAGNOSIS — Y999 Unspecified external cause status: Secondary | ICD-10-CM | POA: Diagnosis not present

## 2019-01-15 DIAGNOSIS — Y939 Activity, unspecified: Secondary | ICD-10-CM | POA: Insufficient documentation

## 2019-01-15 DIAGNOSIS — S61412A Laceration without foreign body of left hand, initial encounter: Secondary | ICD-10-CM | POA: Insufficient documentation

## 2019-01-15 DIAGNOSIS — Z79899 Other long term (current) drug therapy: Secondary | ICD-10-CM | POA: Insufficient documentation

## 2019-01-15 MED ORDER — BACITRACIN-NEOMYCIN-POLYMYXIN 400-5-5000 EX OINT
TOPICAL_OINTMENT | Freq: Once | CUTANEOUS | Status: AC
  Administered 2019-01-15: 1 via TOPICAL
  Filled 2019-01-15: qty 1

## 2019-01-15 MED ORDER — LIDOCAINE HCL (PF) 1 % IJ SOLN
5.0000 mL | Freq: Once | INTRAMUSCULAR | Status: AC
  Administered 2019-01-15: 14:00:00 5 mL

## 2019-01-15 MED ORDER — HYDROCODONE-ACETAMINOPHEN 5-325 MG PO TABS
1.0000 | ORAL_TABLET | Freq: Once | ORAL | Status: AC
  Administered 2019-01-15: 1 via ORAL
  Filled 2019-01-15: qty 1

## 2019-01-15 MED ORDER — LIDOCAINE HCL (PF) 1 % IJ SOLN
INTRAMUSCULAR | Status: AC
  Administered 2019-01-15: 5 mL
  Filled 2019-01-15: qty 5

## 2019-01-15 MED ORDER — HYDROCODONE-ACETAMINOPHEN 5-325 MG PO TABS: 1.0000 | ORAL_TABLET | Freq: Four times a day (QID) | ORAL | 0 refills | Status: DC | PRN

## 2019-01-15 NOTE — Discharge Instructions (Addendum)
Follow discharge care instructions.  Have sutures removed in 10 days.

## 2019-01-15 NOTE — ED Provider Notes (Signed)
[facility] Emergency Department Provider Note   ____________________________________________   First MD Initiated Contact with Patient 01/15/19 1301     (approximate)  I have reviewed the triage vital signs and the nursing notes.   HISTORY  Chief Complaint Laceration    HPI Pamela Meyers is a 33 y.o. female patient present with a laceration across the dorsal aspect of the left hand.  Patient that she was sharpening knife last night when she accidentally cut her hand.  Patient states area was clean and irrigated by husband and Steri-Strips were applied.  Patient states decreased range of motion limited by complaint of pain with flexion and extension of the fingers.  Patient state last tetanus shot was approximately 3 years ago.  Patient rates her pain as 8/10.  Patient described the pain as "sore".  No other palliative measure for complaint.  Patient is right-hand dominant.         Past Medical History:  Diagnosis Date  . Anxiety   . Attention deficit disorder (ADD) without hyperactivity     Patient Active Problem List   Diagnosis Date Noted  . Oral herpes simplex infection 10/21/2015  . Superficial bacterial skin infection 10/19/2015  . Infected skin lesion 09/23/2015  . Wheezing on auscultation 08/23/2015  . Productive cough 08/23/2015  . Dysmenorrhea 08/12/2015  . 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 Head trauma 07/27/2015  . Generalized anxiety disorder 06/23/2015  . 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 APPENDECTOMY    . CHOLECYSTECTOMY    . TUBAL LIGATION      Prior to Admission medications   Medication Sig Start Date End Date Taking? Authorizing Provider  acetaminophen (TYLENOL) 500 MG tablet Take 500 mg by mouth every 6 (six) hours as needed for mild pain, moderate pain or headache. Reported on 06/23/2015    [provider]  amphetamine-dextroamphetamine  (ADDERALL) 10 MG tablet Take 1 tablet (10 mg total) by mouth daily at 12 noon. 03/23/17   Roselee Nova, MD  amphetamine-dextroamphetamine (ADDERALL) 20 MG tablet Take 1 tablet (20 mg total) by mouth every morning. 03/23/17   Roselee Nova, MD  clonazePAM (KLONOPIN) 1 MG tablet Take 1 tablet (1 mg total) by mouth 2 (two) times daily as needed for anxiety. 03/23/17   Roselee Nova, MD  HYDROcodone-acetaminophen (NORCO/VICODIN) 5-325 MG tablet Take 1 tablet by mouth every 6 (six) hours as needed for moderate pain. 01/15/19   Sable Feil, PA-C  norgestimate-ethinyl estradiol (ORTHO-CYCLEN,SPRINTEC,PREVIFEM) 0.25-35 MG-MCG tablet Take 1 tablet by mouth daily. Patient not taking: Reported on 02/22/2017 09/08/13   Leftwich-Kirby, Kathie Dike, CNM  valACYclovir (VALTREX) 1000 MG tablet Take 2 tablets (2,000 mg total) by mouth 2 (two) times daily. 03/09/16   Roselee Nova, MD    Allergies Morphine and related, Penicillins, Ultram [tramadol], and Codeine  Family History  Problem Relation Age of Onset  . Non-Hodgkin's lymphoma Father   . ADD / ADHD Sister   . Heart murmur Maternal Aunt        bypass  . Anxiety disorder Maternal Aunt   . Atrial fibrillation Maternal Grandmother   . Diabetes Maternal Grandmother   . Heart disease Maternal Grandmother   . Parkinson's disease Maternal Grandfather     Social History Social History   Tobacco Use  . Smoking status: Current Some Day Smoker    Packs/day: 0.75  Years: 17.00    Pack years: 12.75    Types: Cigarettes  . Smokeless tobacco: Never Used  Substance Use Topics  . Alcohol use: No  . Drug use: No  Review of Systems Constitutional: No fever/chills Eyes: No visual changes. ENT: No sore throat. Cardiovascular: Denies chest pain. Respiratory: Denies shortness of breath. Gastrointestinal: No abdominal pain.  No nausea, no vomiting.  No diarrhea.  No constipation. Genitourinary: Negative for dysuria. Musculoskeletal: Negative for  back pain. Skin: Negative for rash. Neurological: Negative for headaches, focal weakness or numbness. Psychiatric:  ADD and anxiety. Endocrine:  Hematological/Lymphatic:  Allergic/Immunilogical: Morphine, penicillin, Ultram, and codeine.  ____________________________________________   PHYSICAL EXAM:  VITAL SIGNS: ED Triage Vitals  Enc Vitals Group     BP 01/15/19 1312 (!) 156/100     Pulse Rate 01/15/19 1312 83     Resp 01/15/19 1312 16     Temp 01/15/19 1312 98.3 F (36.8 C)     Temp Source 01/15/19 1312 Oral     SpO2 01/15/19 1312 100 %     Weight 01/15/19 1306 145 lb (65.8 kg)     Height 01/15/19 1306 5' (1.524 m)     Head Circumference --      Peak Flow --      Pain Score 01/15/19 1306 8     Pain Loc --      Pain Edu? --      Excl. in GC? --     Constitutional: Alert and oriented. Well appearing and in no acute distress.  Anxious. Cardiovascular: Normal rate, regular rhythm. Grossly normal heart sounds.  Good peripheral circulation.  Elevated blood pressure. Respiratory: Normal respiratory effort.  No retractions. Lungs CTAB. Neurologic:  Normal speech and language. No gross focal neurologic deficits are appreciated. No gait instability. Skin: 3 cm laceration dorsal aspect of the left wrist approximated with 3 Steri-Strips. Psychiatric: Mood and affect are normal. Speech and behavior are normal.  ____________________________________________   LABS (all labs ordered are listed, but only abnormal results are displayed)  Labs Reviewed - No data to display ____________________________________________  EKG   ____________________________________________  RADIOLOGY  ED MD interpretation:    Official radiology report(s): No results found.  ____________________________________________   PROCEDURES  Procedure(s) performed (including Critical Care):  Marland Kitchen.Marland Kitchen.Laceration Repair  Date/Time: 01/15/2019 2:26 PM Performed by: Brantley FlingMonforton, Cara N, Student-PA Authorized  by: Joni ReiningSmith,  K, PA-C   Consent:    Consent obtained:  Verbal   Consent given by:  Patient   Risks discussed:  Infection, pain, need for additional repair and poor cosmetic result Anesthesia (see MAR for exact dosages):    Anesthesia method:  Local infiltration   Local anesthetic:  Lidocaine 1% w/o epi Laceration details:    Location:  Hand   Hand location:  L hand, dorsum   Length (cm):  3   Depth (mm):  2 Repair type:    Repair type:  Simple Exploration:    Contaminated: no   Treatment:    Area cleansed with:  Betadine and saline   Amount of cleaning:  Standard Skin repair:    Repair method:  Sutures   Suture size:  4-0   Suture material:  Nylon   Suture technique:  Simple interrupted   Number of sutures:  5 Approximation:    Approximation:  Close Post-procedure details:    Dressing:  Antibiotic ointment, sterile dressing and splint for protection   Patient tolerance of procedure:  Tolerated well, no immediate complications  ____________________________________________   INITIAL IMPRESSION / ASSESSMENT AND PLAN / ED COURSE  As part of my medical decision making, I reviewed the following data within the electronic MEDICAL RECORD NUMBER     Patient presents with laceration dorsal aspect of left hand.  See procedure note for wound closure.  Patient given discharge care instructions.    Pamela Meyers was evaluated in Emergency Department on 01/15/2019 for the symptoms described in the history of present illness. She was evaluated in the context of the global COVID-19 pandemic, which necessitated consideration that the patient might be at risk for infection with the SARS-CoV-2 virus that causes COVID-19. Institutional protocols and algorithms that pertain to the evaluation of patients at risk for COVID-19 are in a state of rapid change based on information released by regulatory bodies including the CDC and federal and state organizations. These policies and algorithms  were followed during the patient's care in the ED.       ____________________________________________   FINAL CLINICAL IMPRESSION(S) / ED DIAGNOSES  Final diagnoses:  Laceration of left hand without foreign body, initial encounter     ED Discharge Orders         Ordered    HYDROcodone-acetaminophen (NORCO/VICODIN) 5-325 MG tablet  Every 6 hours PRN     01/15/19 1430           Note:  This document was prepared using Dragon voice recognition software and may include unintentional dictation errors.    Joni Reining, PA-C 01/15/19 1430    Emily Filbert, MD 01/15/19 1434

## 2019-01-15 NOTE — ED Triage Notes (Signed)
Pt in via POV, reports laceration to left hand, states she was sharpening pocket knife last night when incident happened.  Decreased ROM noted.  Reports steri-strips being in place, area bandaged upon arrival.  NAD noted at this time.

## 2019-06-02 ENCOUNTER — Encounter: Payer: Self-pay | Admitting: Emergency Medicine

## 2019-06-02 ENCOUNTER — Other Ambulatory Visit: Payer: Self-pay

## 2019-06-02 ENCOUNTER — Emergency Department
Admission: EM | Admit: 2019-06-02 | Discharge: 2019-06-02 | Disposition: A | Discharge status: Home / Self Care | Payer: Medicaid Other | Attending: Emergency Medicine | Admitting: Emergency Medicine

## 2019-06-02 DIAGNOSIS — M545 Low back pain: Secondary | ICD-10-CM | POA: Diagnosis present

## 2019-06-02 DIAGNOSIS — R1031 Right lower quadrant pain: Secondary | ICD-10-CM | POA: Insufficient documentation

## 2019-06-02 DIAGNOSIS — F17210 Nicotine dependence, cigarettes, uncomplicated: Secondary | ICD-10-CM | POA: Insufficient documentation

## 2019-06-02 DIAGNOSIS — N390 Urinary tract infection, site not specified: Secondary | ICD-10-CM | POA: Diagnosis not present

## 2019-06-02 LAB — URINALYSIS, COMPLETE (UACMP) WITH MICROSCOPIC
Bilirubin Urine: NEGATIVE
Glucose, UA: NEGATIVE mg/dL
Ketones, ur: NEGATIVE mg/dL
Nitrite: POSITIVE — AB
Protein, ur: 100 mg/dL — AB
Specific Gravity, Urine: 1.014 (ref 1.005–1.030)
WBC, UA: >50 WBC/hpf — ABNORMAL HIGH (ref 0–5)
pH: 6 (ref 5.0–8.0)

## 2019-06-02 LAB — POCT PREGNANCY, URINE: Preg Test, Ur: NEGATIVE

## 2019-06-02 MED ORDER — OXYCODONE-ACETAMINOPHEN 7.5-325 MG PO TABS: 1.0000 | ORAL_TABLET | Freq: Four times a day (QID) | ORAL | 0 refills | Status: DC | PRN

## 2019-06-02 MED ORDER — SULFAMETHOXAZOLE-TRIMETHOPRIM 800-160 MG PO TABS: 1.0000 | ORAL_TABLET | Freq: Two times a day (BID) | ORAL | 0 refills | Status: DC

## 2019-06-02 MED ORDER — ONDANSETRON 8 MG PO TBDP
8.0000 mg | ORAL_TABLET | Freq: Once | ORAL | Status: AC
  Administered 2019-06-02: 8 mg via ORAL
  Filled 2019-06-02: qty 1

## 2019-06-02 MED ORDER — PHENAZOPYRIDINE HCL 200 MG PO TABS: 200.0000 mg | ORAL_TABLET | Freq: Three times a day (TID) | ORAL | 0 refills | Status: DC | PRN

## 2019-06-02 MED ORDER — OXYCODONE-ACETAMINOPHEN 5-325 MG PO TABS
1.0000 | ORAL_TABLET | Freq: Once | ORAL | Status: AC
  Administered 2019-06-02: 1 via ORAL
  Filled 2019-06-02: qty 1

## 2019-06-02 NOTE — ED Provider Notes (Signed)
[facility] Emergency Department Provider Note   ____________________________________________   First MD Initiated Contact with Patient 06/02/19 [phone]     (approximate)  I have reviewed the triage vital signs and the nursing notes.   HISTORY  Chief Complaint Back Pain    HPI Pamela Meyers is a 34 y.o. female patient complaining of low back pain with radicular component.  Patient also complain of malodorous urine, urinary frequency, and dysuria.  Patient denies vaginal discharge or fever.  Patient state right flank pain.         Past Medical History:  Diagnosis Date  . Anxiety   . Attention deficit disorder (ADD) without hyperactivity     Patient Active Problem List   Diagnosis Date Noted  . Oral herpes simplex infection 10/21/2015  . Superficial bacterial skin infection 10/19/2015  . Infected skin lesion 09/23/2015  . Wheezing on auscultation 08/23/2015  . Productive cough 08/23/2015  . Dysmenorrhea 08/12/2015  . 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 Head trauma 07/27/2015  . Generalized anxiety disorder 06/23/2015  . 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 APPENDECTOMY    . CHOLECYSTECTOMY    . TUBAL LIGATION      Prior to Admission medications   Medication Sig Start Date End Date Taking? Authorizing Provider  acetaminophen (TYLENOL) 500 MG tablet Take 500 mg by mouth every 6 (six) hours as needed for mild pain, moderate pain or headache. Reported on 06/23/2015    [provider]  amphetamine-dextroamphetamine (ADDERALL) 10 MG tablet Take 1 tablet (10 mg total) by mouth daily at 12 noon. 03/23/17   Ellyn Hack, MD  amphetamine-dextroamphetamine (ADDERALL) 20 MG tablet Take 1 tablet (20 mg total) by mouth every morning. 03/23/17   Ellyn Hack, MD  clonazePAM (KLONOPIN) 1 MG tablet Take 1 tablet (1 mg total) by mouth 2 (two) times daily as needed for  anxiety. 03/23/17   Ellyn Hack, MD  HYDROcodone-acetaminophen (NORCO/VICODIN) 5-325 MG tablet Take 1 tablet by mouth every 6 (six) hours as needed for moderate pain. 01/15/19   Joni Reining, PA-C  norgestimate-ethinyl estradiol (ORTHO-CYCLEN,SPRINTEC,PREVIFEM) 0.25-35 MG-MCG tablet Take 1 tablet by mouth daily. Patient not taking: Reported on 02/22/2017 09/08/13   Leftwich-Kirby, Wilmer Floor, CNM  oxyCODONE-acetaminophen (PERCOCET) 7.5-325 MG tablet Take 1 tablet by mouth every 6 (six) hours as needed. 06/02/19   Joni Reining, PA-C  phenazopyridine (PYRIDIUM) 200 MG tablet Take 1 tablet (200 mg total) by mouth 3 (three) times daily as needed for pain. 06/02/19   Joni Reining, PA-C  sulfamethoxazole-trimethoprim (BACTRIM DS) 800-160 MG tablet Take 1 tablet by mouth 2 (two) times daily. 06/02/19   Joni Reining, PA-C  valACYclovir (VALTREX) 1000 MG tablet Take 2 tablets (2,000 mg total) by mouth 2 (two) times daily. 03/09/16   Ellyn Hack, MD    Allergies Morphine and related, Penicillins, Ultram [tramadol], and Codeine  Family History  Problem Relation Age of Onset  . Non-Hodgkin's lymphoma Father   . ADD / ADHD Sister   . Heart murmur Maternal Aunt        bypass  . Anxiety disorder Maternal Aunt   . Atrial fibrillation Maternal Grandmother   . Diabetes Maternal Grandmother   . Heart disease Maternal Grandmother   . Parkinson's disease Maternal Grandfather     Social History Social History   Tobacco Use  . Smoking  status: Current Some Day Smoker    Packs/day: 0.75    Years: 17.00    Pack years: 12.75    Types: Cigarettes  . Smokeless tobacco: Never Used  Substance Use Topics  . Alcohol use: No  . Drug use: No    Review of Systems Constitutional: No fever/chills Eyes: No visual changes. ENT: No sore throat. Cardiovascular: Denies chest pain. Respiratory: Denies shortness of breath. Gastrointestinal: No abdominal pain.  No nausea, no vomiting.  No diarrhea.  No  constipation. Genitourinary: Negative for dysuria. Musculoskeletal: Positive for back pain. Skin: Negative for rash. Neurological: Negative for headaches, focal weakness or numbness. Psychiatric:  ADD and anxiety. Allergic/Immunilogical: Morphine, penicillin, Ultram, and codeine.  ____________________________________________   PHYSICAL EXAM:  VITAL SIGNS: ED Triage Vitals  Enc Vitals Group     BP 06/02/19 0746 104/67     Pulse Rate 06/02/19 0746 (!) 120     Resp 06/02/19 0746 18     Temp 06/02/19 0746 99.1 F (37.3 C)     Temp Source 06/02/19 0746 Oral     SpO2 06/02/19 0746 99 %     Weight 06/02/19 0746 145 lb (65.8 kg)     Height 06/02/19 0746 5\' 4"  (1.626 m)     Head Circumference --      Peak Flow --      Pain Score 06/02/19 0736 10     Pain Loc --      Pain Edu? --      Excl. in GC? --    Constitutional: Alert and oriented.  Moderate distress.   Eyes: Conjunctivae are normal. PERRL. EOMI. Head: Atraumatic. Nose: No congestion/rhinnorhea. Mouth/Throat: Mucous membranes are moist.  Oropharynx non-erythematous. Cardiovascular: Tachycardic regular rhythm. Grossly normal heart sounds.  Good peripheral circulation. Respiratory: Normal respiratory effort.  No retractions. Lungs CTAB. Gastrointestinal: Soft and nontender. No distention. No abdominal bruits.  Right CVA tenderness. Genitourinary: Deferred Musculoskeletal: No lower extremity tenderness nor edema.  No joint effusions. Neurologic:  Normal speech and language. No gross focal neurologic deficits are appreciated. No gait instability. Skin:  Skin is warm, dry and intact. No rash noted. Psychiatric: Mood and affect are normal. Speech and behavior are normal.  ____________________________________________   LABS (all labs ordered are listed, but only abnormal results are displayed)  Labs Reviewed  URINALYSIS, COMPLETE (UACMP) WITH MICROSCOPIC - Abnormal; Notable for the following components:      Result Value    Color, Urine YELLOW (*)    APPearance CLOUDY (*)    Hgb urine dipstick SMALL (*)    Protein, ur 100 (*)    Nitrite POSITIVE (*)    Leukocytes,Ua LARGE (*)    WBC, UA >50 (*)    Bacteria, UA MANY (*)    All other components within normal limits  POCT PREGNANCY, URINE   ____________________________________________  EKG   ____________________________________________  RADIOLOGY  ED MD interpretation:    Official radiology report(s): No results found.  ____________________________________________   PROCEDURES  Procedure(s) performed (including Critical Care):  Procedures   ____________________________________________   INITIAL IMPRESSION / ASSESSMENT AND PLAN / ED COURSE  As part of my medical decision making, I reviewed the following data within the electronic MEDICAL RECORD NUMBER   Patient presents with flank pain and dysuria.  Discussed discussed lab results with patient consistent with urinary tract infection.  Patient given discharge care instructions and advised take medication as directed.  Patient advised to follow-up with her PCP in 10 days to repeat urinalysis.  Take medication as directed.       Pamela Meyers was evaluated in Emergency Department on 06/02/2019 for the symptoms described in the history of present illness. She was evaluated in the context of the global COVID-19 pandemic, which necessitated consideration that the patient might be at risk for infection with the SARS-CoV-2 virus that causes COVID-19. Institutional protocols and algorithms that pertain to the evaluation of patients at risk for COVID-19 are in a state of rapid change based on information released by regulatory bodies including the CDC and federal and state organizations. These policies and algorithms were followed during the patient's care in the ED.       ____________________________________________   FINAL CLINICAL IMPRESSION(S) / ED DIAGNOSES  Final diagnoses:  Lower urinary  tract infectious disease     ED Discharge Orders         Ordered    sulfamethoxazole-trimethoprim (BACTRIM DS) 800-160 MG tablet  2 times daily     06/02/19 0853    oxyCODONE-acetaminophen (PERCOCET) 7.5-325 MG tablet  Every 6 hours PRN     06/02/19 0853    phenazopyridine (PYRIDIUM) 200 MG tablet  3 times daily PRN     06/02/19 0853           Note:  This document was prepared using Dragon voice recognition software and may include unintentional dictation errors.    Sable Feil, PA-C 06/02/19 1505    Earleen Newport, MD 06/02/19 1336

## 2019-06-02 NOTE — ED Triage Notes (Signed)
Patient reports low back pain radiating to "bottom". Pain worse with ambulation. No known injury.

## 2020-04-09 ENCOUNTER — Emergency Department
Admission: EM | Admit: 2020-04-09 | Discharge: 2020-04-09 | Disposition: A | Discharge status: Home / Self Care | Payer: Medicaid Other | Attending: Emergency Medicine | Admitting: Emergency Medicine

## 2020-04-09 ENCOUNTER — Other Ambulatory Visit: Payer: Self-pay

## 2020-04-09 DIAGNOSIS — F17210 Nicotine dependence, cigarettes, uncomplicated: Secondary | ICD-10-CM | POA: Diagnosis not present

## 2020-04-09 DIAGNOSIS — R070 Pain in throat: Secondary | ICD-10-CM | POA: Diagnosis not present

## 2020-04-09 DIAGNOSIS — L509 Urticaria, unspecified: Secondary | ICD-10-CM | POA: Insufficient documentation

## 2020-04-09 DIAGNOSIS — T7840XA Allergy, unspecified, initial encounter: Secondary | ICD-10-CM

## 2020-04-09 MED ORDER — FAMOTIDINE 20 MG PO TABS
20.0000 mg | ORAL_TABLET | Freq: Once | ORAL | Status: AC
  Administered 2020-04-09: 20 mg via ORAL
  Filled 2020-04-09: qty 1

## 2020-04-09 MED ORDER — PREDNISONE 20 MG PO TABS: 40.0000 mg | ORAL_TABLET | Freq: Every day | ORAL | 0 refills | Status: DC

## 2020-04-09 MED ORDER — DIPHENHYDRAMINE HCL 50 MG/ML IJ SOLN
50.0000 mg | Freq: Once | INTRAMUSCULAR | Status: AC
  Administered 2020-04-09: 50 mg via INTRAVENOUS
  Filled 2020-04-09: qty 1

## 2020-04-09 MED ORDER — METHYLPREDNISOLONE SODIUM SUCC 125 MG IJ SOLR
125.0000 mg | Freq: Once | INTRAMUSCULAR | Status: AC
  Administered 2020-04-09: 125 mg via INTRAVENOUS
  Filled 2020-04-09: qty 2

## 2020-04-09 NOTE — ED Notes (Signed)
Pt presents with hives to both lower extremities. Pt states reaction started two days ago and not started to spread to her abdomen and upper extremities. Pt states she has been taking benadryl at home with minimal relief.   Pt denies shortness of breath but does state she is experiencing "tightness."

## 2020-04-09 NOTE — ED Notes (Signed)
Pt resting comfortably at this time. Pt states rash on bilateral thighs and when pt was asked how her breathing was pt states "its still tight",no stridor noted. No resp distress noted. Pt on RA with a 02 sat of 100%.

## 2020-04-09 NOTE — ED Notes (Addendum)
This RN called husband with no answer at this time.   [phone]

## 2020-04-09 NOTE — ED Notes (Signed)
D/C and new RX discussed with pt, pt verbalized understanding. NAD noted. Pt educated for s/s to return to ER, pt verbalized understanding. VSS.

## 2020-04-09 NOTE — ED Provider Notes (Signed)
-----------------------------------------   9:58 AM on 04/09/2020 -----------------------------------------  Patient care assumed from Dr. Katrinka Blazing.  States her itching is much improved continues to have some redness of her bilateral anterior thighs however it is receding from the outlined area of skin marker.  Denies any swelling in her throat tongue or shortness of breath.  We will discharge patient home on steroids for the next 5 days, discussed Benadryl use at home 50 mg every 6 hours as needed.  I also discussed return precautions.  Patient agreeable to plan of care.  Will shower when she gets home.   Minna Antis, MD 04/09/20 [phone]

## 2020-04-09 NOTE — ED Triage Notes (Signed)
Pt put new pair of pants on yesterday that had not been washed. Redness to bilat thighs, abd, and now some noted to chest. Pt states has worsened since it started and now feels tightness to throat.

## 2020-04-09 NOTE — ED Provider Notes (Signed)
[facility] Emergency Department Provider Note ____________________________________________   Event Date/Time   First MD Initiated Contact with Patient 04/09/20 [phone]     (approximate)  I have reviewed the triage vital signs and the nursing notes.  HISTORY  Chief Complaint Allergic Reaction   HPI Pamela Meyers is a 35 y.o. femalewho presents to the ED for evaluation of allergic reaction.  Chart review indicates no relevant medical history.  Patient reports concern for allergic reaction to her bilateral thighs.  She reports trying out a new pair of jeans that were not washed yesterday, and about 1 hour after that, and progressively worsening throughout the day is bilateral thigh itchiness, red discoloration and swelling.  She reports that this is never happened before.  She does not have an EpiPen at home.  Denies any trauma, fever, pain, vaginal discharge or bleeding, dysuria, diarrhea or abdominal tenderness/pain.  She reports a "scratchy throat" but denies any trouble breathing, abdominal pain or emesis.   Past Medical History:  Diagnosis Date  . Anxiety   . Attention deficit disorder (ADD) without hyperactivity     Patient Active Problem List   Diagnosis Date Noted  . Oral herpes simplex infection 10/21/2015  . Superficial bacterial skin infection 10/19/2015  . Infected skin lesion 09/23/2015  . Wheezing on auscultation 08/23/2015  . Productive cough 08/23/2015  . Dysmenorrhea 08/12/2015  . 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 Head trauma 07/27/2015  . Generalized anxiety disorder 06/23/2015  . 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 APPENDECTOMY    . CHOLECYSTECTOMY    . TUBAL LIGATION      Prior to Admission medications   Medication Sig Start Date End Date Taking? Authorizing Provider  acetaminophen (TYLENOL) 500 MG tablet Take 500 mg by mouth every 6 (six) hours as  needed for mild pain, moderate pain or headache. Reported on 06/23/2015    [provider]  amphetamine-dextroamphetamine (ADDERALL) 10 MG tablet Take 1 tablet (10 mg total) by mouth daily at 12 noon. 03/23/17   Ellyn Hack, MD  amphetamine-dextroamphetamine (ADDERALL) 20 MG tablet Take 1 tablet (20 mg total) by mouth every morning. 03/23/17   Ellyn Hack, MD  clonazePAM (KLONOPIN) 1 MG tablet Take 1 tablet (1 mg total) by mouth 2 (two) times daily as needed for anxiety. 03/23/17   Ellyn Hack, MD  HYDROcodone-acetaminophen (NORCO/VICODIN) 5-325 MG tablet Take 1 tablet by mouth every 6 (six) hours as needed for moderate pain. 01/15/19   Joni Reining, PA-C  norgestimate-ethinyl estradiol (ORTHO-CYCLEN,SPRINTEC,PREVIFEM) 0.25-35 MG-MCG tablet Take 1 tablet by mouth daily. Patient not taking: Reported on 02/22/2017 09/08/13   Leftwich-Kirby, Wilmer Floor, CNM  oxyCODONE-acetaminophen (PERCOCET) 7.5-325 MG tablet Take 1 tablet by mouth every 6 (six) hours as needed. 06/02/19   Joni Reining, PA-C  phenazopyridine (PYRIDIUM) 200 MG tablet Take 1 tablet (200 mg total) by mouth 3 (three) times daily as needed for pain. 06/02/19   Joni Reining, PA-C  sulfamethoxazole-trimethoprim (BACTRIM DS) 800-160 MG tablet Take 1 tablet by mouth 2 (two) times daily. 06/02/19   Joni Reining, PA-C  valACYclovir (VALTREX) 1000 MG tablet Take 2 tablets (2,000 mg total) by mouth 2 (two) times daily. 03/09/16   Ellyn Hack, MD    Allergies Morphine and related, Penicillins, Ultram [tramadol], and Codeine  Family History  Problem Relation Age of Onset  . Non-Hodgkin's  lymphoma Father   . ADD / ADHD Sister   . Heart murmur Maternal Aunt        bypass  . Anxiety disorder Maternal Aunt   . Atrial fibrillation Maternal Grandmother   . Diabetes Maternal Grandmother   . Heart disease Maternal Grandmother   . Parkinson's disease Maternal Grandfather     Social History Social History   Tobacco  Use  . Smoking status: Current Some Day Smoker    Packs/day: 0.75    Years: 17.00    Pack years: 12.75    Types: Cigarettes  . Smokeless tobacco: Never Used  Vaping Use  . Vaping Use: Never used  Substance Use Topics  . Alcohol use: No  . Drug use: No    Review of Systems  Constitutional: No fever/chills Eyes: No visual changes. ENT: No sore throat. Cardiovascular: Denies chest pain. Respiratory: Denies shortness of breath. Gastrointestinal: No abdominal pain.  No nausea, no vomiting.  No diarrhea.  No constipation. Genitourinary: Negative for dysuria. Musculoskeletal: Negative for back pain. Skin: Positive for rash. Neurological: Negative for headaches, focal weakness or numbness.  ____________________________________________   PHYSICAL EXAM:  VITAL SIGNS: Vitals:   04/09/20 0609 04/09/20 0700  BP: (!) 161/126 (!) 133/98  Pulse: (!) 104 94  Resp: 20   Temp: 98.2 F (36.8 C)   SpO2: 100% 100%     Constitutional: Alert and oriented. Well appearing and in no acute distress. Eyes: Conjunctivae are normal. PERRL. EOMI. Head: Atraumatic. Nose: No congestion/rhinnorhea. Mouth/Throat: Mucous membranes are moist.  Oropharynx non-erythematous. Neck: No stridor. No cervical spine tenderness to palpation. Cardiovascular: Normal rate, regular rhythm. Grossly normal heart sounds.  Good peripheral circulation. Respiratory: Normal respiratory effort.  No retractions. Lungs CTAB. Gastrointestinal: Soft , nondistended, nontender to palpation. No CVA tenderness. Musculoskeletal: No lower extremity tenderness nor edema.  No joint effusions. No signs of acute trauma. Neurologic:  Normal speech and language. No gross focal neurologic deficits are appreciated. No gait instability noted. Skin: Confluent erythema, hives, to the bilateral anterior thighs, as pictured below.  Stops at the bilateral knees and appears white symmetric bilaterally.  Does not extend posteriorly.  Stops around  her inguinal canals and does not extend into her genitourinary area.  Soft thighs without firmness or severe pain.  Distally neurovascularly intact. Psychiatric: Mood and affect are normal. Speech and behavior are normal.     ____________________________________________   PROCEDURES and INTERVENTIONS  Procedure(s) performed (including Critical Care):  Procedures  Medications  methylPREDNISolone sodium succinate (SOLU-MEDROL) 125 mg/2 mL injection 125 mg (125 mg Intravenous Given 04/09/20 0639)  diphenhydrAMINE (BENADRYL) injection 50 mg (50 mg Intravenous Given 04/09/20 0640)  famotidine (PEPCID) tablet 20 mg (20 mg Oral Given 04/09/20 0641)    ____________________________________________   MDM / ED COURSE   35 year old woman presents to the ED with hives without evidence of anaphylaxis.  Hemodynamically stable.  Exam with hives to her bilateral anterior thighs, inspected above.  No stigmata of anaphylaxis, cellulitis, distress or neurovascular deficits.  No compartment syndrome.  Provided IV Solu-Medrol and Benadryl with some clearance after a brief reevaluation.  Patient signed out to oncoming provider to ensure continued improvement prior to likely outpatient management.  No indication for EpiPen at this time.  Clinical Course as of 04/09/20 0744  Fri Apr 09, 2020  [id number] Reassessed.  Slight clearance of her hives.  No worsening.  No indications for EpiPen at this time.  Patient signed out to oncoming provider. [DS]  Clinical Course User Index [DS] Delton Prairie, MD    ____________________________________________   FINAL CLINICAL IMPRESSION(S) / ED DIAGNOSES  Final diagnoses:  Allergic reaction, initial encounter  Urticaria     ED Discharge Orders    None       Cannon Arreola Katrinka Blazing   Note:  This document was prepared using Dragon voice recognition software and may include unintentional dictation errors.   Delton Prairie, MD 04/09/20 [phone]

## 2020-04-09 NOTE — Discharge Instructions (Signed)
As we discussed please use your prescribed steroid/prednisone starting tomorrow 04/10/2020.  You may take Benadryl 50 mg every 6 hours as needed for itching.  Return immediately to the emergency department for any swelling in your throat your tongue, any shortness of breath, or any other symptom personally concerning to yourself.

## 2020-08-19 ENCOUNTER — Emergency Department
Admission: EM | Admit: 2020-08-19 | Discharge: 2020-08-19 | Disposition: A | Discharge status: Home / Self Care | Payer: Medicaid Other | Attending: Emergency Medicine | Admitting: Emergency Medicine

## 2020-08-19 ENCOUNTER — Other Ambulatory Visit: Payer: Self-pay

## 2020-08-19 DIAGNOSIS — T401X1A Poisoning by heroin, accidental (unintentional), initial encounter: Secondary | ICD-10-CM | POA: Diagnosis present

## 2020-08-19 DIAGNOSIS — F17210 Nicotine dependence, cigarettes, uncomplicated: Secondary | ICD-10-CM | POA: Insufficient documentation

## 2020-08-19 MED ORDER — ACETAMINOPHEN 500 MG PO TABS
1000.0000 mg | ORAL_TABLET | Freq: Once | ORAL | Status: AC
  Administered 2020-08-19: 1000 mg via ORAL
  Filled 2020-08-19: qty 2

## 2020-08-19 NOTE — ED Triage Notes (Signed)
BIB [facility] from work where pt was found on ground, unresponsive and cyanotic. Pt was given 2mg  intranasal narcan en route and became responsive within a few minutes of administration. Pt admitted to snorting heroin today, has not used "in a while". Tearful on arrival. 20G PIV established by EMS. CBG and VS WNL

## 2020-08-19 NOTE — ED Notes (Signed)
Pt refused wc, pt's husband called by pt. Pt ambulating in hall well, gait steady, pt alert and oriented x 4. Pt states she wants to wait for husband outside. Pt encouraged to wait in lobby due to heat. Pt dc to lobby/outside to wait for husband.

## 2020-08-19 NOTE — ED Notes (Signed)
Pt given phone to call husband 

## 2020-08-19 NOTE — ED Notes (Addendum)
IV placed by EMS dc'd

## 2020-08-19 NOTE — ED Notes (Signed)
Pt medical at this time

## 2020-08-19 NOTE — ED Provider Notes (Signed)
[facility] Emergency Department Provider Note   ____________________________________________   Event Date/Time   First MD Initiated Contact with Patient 08/19/20 1646     (approximate)  I have reviewed the triage vital signs and the nursing notes.   HISTORY  Chief Complaint Drug Overdose    HPI Pamela Meyers is a 35 y.o. female with past medical history of ADHD and anxiety who presents to the ED following overdose.  Patient reports that she had snorted heroin earlier today and was at work attempting to move tables.  She does not remember what happened next but woke up with EMS providers standing around her.  Her coworker was concerned that she had a possible seizure, but EMS reports patient cyanotic with minimal respiratory effort and pinpoint pupils on their arrival.  She was given 2 mg of intranasal Narcan and subsequently woke up.  She currently denies any complaints including chest pain or shortness of breath.  She denies any intent to harm herself and states she did not take anything other than the heroin.        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 Authorizing Provider  acetaminophen (TYLENOL) 500 MG tablet Take 500 mg by mouth every 6 (six) hours as  needed for mild pain, moderate pain or headache. Reported on 06/23/2015    [provider]  amphetamine-dextroamphetamine (ADDERALL) 10 MG tablet Take 1 tablet (10 mg total) by mouth daily at 12 noon. 03/23/17   Ellyn Hack, MD  amphetamine-dextroamphetamine (ADDERALL) 20 MG tablet Take 1 tablet (20 mg total) by mouth every morning. 03/23/17   Ellyn Hack, MD  clonazePAM (KLONOPIN) 1 MG tablet Take 1 tablet (1 mg total) by mouth 2 (two) times daily as needed for anxiety. 03/23/17   Ellyn Hack, MD  HYDROcodone-acetaminophen (NORCO/VICODIN) 5-325 MG tablet Take 1 tablet by mouth every 6 (six) hours as needed for moderate pain. 01/15/19   Joni Reining, PA-C  norgestimate-ethinyl estradiol (ORTHO-CYCLEN,SPRINTEC,PREVIFEM) 0.25-35 MG-MCG tablet Take 1 tablet by mouth daily. Patient not taking: Reported on 02/22/2017 09/08/13   Leftwich-Kirby, Wilmer Floor, CNM  oxyCODONE-acetaminophen (PERCOCET) 7.5-325 MG tablet Take 1 tablet by mouth every 6 (six) hours as needed. 06/02/19   Joni Reining, PA-C  phenazopyridine (PYRIDIUM) 200 MG tablet Take 1 tablet (200 mg total) by mouth 3 (three) times daily as needed for pain. 06/02/19   Joni Reining, PA-C  predniSONE (DELTASONE) 20 MG tablet Take 2 tablets (40 mg total) by mouth daily with breakfast. 04/09/20   Minna Antis, MD  sulfamethoxazole-trimethoprim (BACTRIM DS) 800-160 MG tablet Take 1 tablet by mouth 2 (two) times daily. 06/02/19   Joni Reining, PA-C  valACYclovir (VALTREX) 1000 MG tablet Take 2  tablets (2,000 mg total) by mouth 2 (two) times daily. 03/09/16   Ellyn Hack, MD    Allergies Morphine and related, Penicillins, Ultram [tramadol], and Codeine  Family History  Problem Relation Age of Onset   Non-Hodgkin's lymphoma Father    ADD / ADHD Sister    Heart murmur Maternal Aunt        bypass   Anxiety disorder Maternal Aunt    Atrial fibrillation Maternal Grandmother    Diabetes Maternal Grandmother    Heart  disease Maternal Grandmother    Parkinson's disease Maternal Grandfather     Social History Social History   Tobacco Use   Smoking status: Some Days    Packs/day: 0.50    Years: 17.00    Pack years: 8.50    Types: Cigarettes   Smokeless tobacco: Never  Vaping Use   Vaping Use: Never used  Substance Use Topics   Alcohol use: No   Drug use: No    Review of Systems  Constitutional: No fever/chills.  Positive for overdose. Eyes: No visual changes. ENT: No sore throat. Cardiovascular: Denies chest pain. Respiratory: Denies shortness of breath. Gastrointestinal: No abdominal pain.  No nausea, no vomiting.  No diarrhea.  No constipation. Genitourinary: Negative for dysuria. Musculoskeletal: Negative for back pain. Skin: Negative for rash. Neurological: Negative for headaches, focal weakness or numbness.  ____________________________________________   PHYSICAL EXAM:  VITAL SIGNS: ED Triage Vitals  Enc Vitals Group     BP 08/19/20 1646 (!) 143/104     Pulse Rate 08/19/20 1646 98     Resp 08/19/20 1646 14     Temp 08/19/20 1646 98.3 F (36.8 C)     Temp Source 08/19/20 1646 Oral     SpO2 08/19/20 1646 100 %     Weight 08/19/20 1647 155 lb (70.3 kg)     Height 08/19/20 1647 5\' 1"  (1.549 m)     Head Circumference --      Peak Flow --      Pain Score 08/19/20 1646 0     Pain Loc --      Pain Edu? --      Excl. in GC? --     Constitutional: Alert and oriented. Eyes: Conjunctivae are normal. Head: Atraumatic. Nose: No congestion/rhinnorhea. Mouth/Throat: Mucous membranes are moist. Neck: Normal ROM Cardiovascular: Normal rate, regular rhythm. Grossly normal heart sounds.  2+ radial pulses bilaterally. Respiratory: Normal respiratory effort.  No retractions. Lungs CTAB. Gastrointestinal: Soft and nontender. No distention. Genitourinary: deferred Musculoskeletal: No lower extremity tenderness nor edema. Neurologic:  Normal speech and language. No gross focal  neurologic deficits are appreciated. Skin:  Skin is warm, dry and intact. No rash noted. Psychiatric: Mood and affect are normal. Speech and behavior are normal.  ____________________________________________   LABS (all labs ordered are listed, but only abnormal results are displayed)  Labs Reviewed - No data to display ____________________________________________  EKG  ED ECG REPORT I, 10/20/20, the attending physician, personally viewed and interpreted this ECG.   Date: 08/19/2020  EKG Time: 16:49  Rate: 99  Rhythm: normal sinus rhythm  Axis: Normal  Intervals:none  ST&T Change: None   PROCEDURES  Procedure(s) performed (including Critical Care):  Procedures   ____________________________________________   INITIAL IMPRESSION / ASSESSMENT AND PLAN / ED COURSE      35 year old female with no significant past medical history presents to the ED after being found unresponsive at work, noted to have pinpoint pupils with minimal respiratory effort  by EMS and subsequently given Narcan.  She is now awake and alert, admits to using heroin but denies taking anything else and denies any intent to harm herself.  EKG shows no evidence of arrhythmia or ischemia.  We will observe patient here in the ED for any recurrent respiratory depression.  Patient remains awake and alert without any further respiratory depression.  She is appropriate for discharge home and was counseled to avoid opiates.  She was counseled to return to the ED for new worsening symptoms, patient agrees with plan.      ____________________________________________   FINAL CLINICAL IMPRESSION(S) / ED DIAGNOSES  Final diagnoses:  Accidental overdose of heroin, initial encounter [facility])     ED Discharge Orders     None        Note:  This document was prepared using Dragon voice recognition software and may include unintentional dictation errors.    Chesley Noon, MD 08/19/20 [phone]

## 2020-08-19 NOTE — ED Notes (Signed)
Pt's husband called per pt request.

## 2021-07-13 ENCOUNTER — Emergency Department (HOSPITAL_COMMUNITY): Payer: Medicaid Other

## 2021-07-13 ENCOUNTER — Emergency Department (HOSPITAL_COMMUNITY)
Admission: EM | Admit: 2021-07-13 | Discharge: 2021-07-14 | Disposition: A | Discharge status: Home / Self Care | Payer: Medicaid Other | Attending: Emergency Medicine | Admitting: Emergency Medicine

## 2021-07-13 ENCOUNTER — Other Ambulatory Visit: Payer: Self-pay

## 2021-07-13 DIAGNOSIS — Y92410 Unspecified street and highway as the place of occurrence of the external cause: Secondary | ICD-10-CM | POA: Insufficient documentation

## 2021-07-13 DIAGNOSIS — S60511A Abrasion of right hand, initial encounter: Secondary | ICD-10-CM | POA: Insufficient documentation

## 2021-07-13 DIAGNOSIS — S0181XA Laceration without foreign body of other part of head, initial encounter: Secondary | ICD-10-CM | POA: Diagnosis not present

## 2021-07-13 DIAGNOSIS — S40211A Abrasion of right shoulder, initial encounter: Secondary | ICD-10-CM | POA: Insufficient documentation

## 2021-07-13 DIAGNOSIS — S01512A Laceration without foreign body of oral cavity, initial encounter: Secondary | ICD-10-CM | POA: Diagnosis not present

## 2021-07-13 DIAGNOSIS — Z20822 Contact with and (suspected) exposure to covid-19: Secondary | ICD-10-CM | POA: Insufficient documentation

## 2021-07-13 DIAGNOSIS — M25521 Pain in right elbow: Secondary | ICD-10-CM | POA: Diagnosis not present

## 2021-07-13 DIAGNOSIS — M25531 Pain in right wrist: Secondary | ICD-10-CM | POA: Diagnosis not present

## 2021-07-13 DIAGNOSIS — S50811A Abrasion of right forearm, initial encounter: Secondary | ICD-10-CM | POA: Diagnosis not present

## 2021-07-13 DIAGNOSIS — S0083XA Contusion of other part of head, initial encounter: Secondary | ICD-10-CM

## 2021-07-13 DIAGNOSIS — S0990XA Unspecified injury of head, initial encounter: Secondary | ICD-10-CM | POA: Diagnosis present

## 2021-07-13 LAB — I-STAT CHEM 8, ED
BUN: 7 mg/dL (ref 6–20)
Calcium, Ion: 1.04 mmol/L — ABNORMAL LOW (ref 1.15–1.40)
Chloride: 103 mmol/L (ref 98–111)
Creatinine, Ser: 0.9 mg/dL (ref 0.44–1.00)
Glucose, Bld: 82 mg/dL (ref 70–99)
HCT: 38 % (ref 36.0–46.0)
Hemoglobin: 12.9 g/dL (ref 12.0–15.0)
Potassium: 3.7 mmol/L (ref 3.5–5.1)
Sodium: 138 mmol/L (ref 135–145)
TCO2: 24 mmol/L (ref 22–32)

## 2021-07-13 LAB — COMPREHENSIVE METABOLIC PANEL
ALT: 24 U/L (ref 0–44)
AST: 28 U/L (ref 15–41)
Albumin: 3.7 g/dL (ref 3.5–5.0)
Alkaline Phosphatase: 69 U/L (ref 38–126)
Anion gap: 9 (ref 5–15)
BUN: 8 mg/dL (ref 6–20)
CO2: 25 mmol/L (ref 22–32)
Calcium: 9.3 mg/dL (ref 8.9–10.3)
Chloride: 105 mmol/L (ref 98–111)
Creatinine, Ser: 1.04 mg/dL — ABNORMAL HIGH (ref 0.44–1.00)
GFR, Estimated: >60 mL/min (ref >60)
Glucose, Bld: 89 mg/dL (ref 70–99)
Potassium: 3.8 mmol/L (ref 3.5–5.1)
Sodium: 139 mmol/L (ref 135–145)
Total Bilirubin: 0.2 mg/dL — ABNORMAL LOW (ref 0.3–1.2)
Total Protein: 6.5 g/dL (ref 6.5–8.1)

## 2021-07-13 LAB — RESP PANEL BY RT-PCR (FLU A&B, COVID) ARPGX2
Influenza A by PCR: NEGATIVE
Influenza B by PCR: NEGATIVE
SARS Coronavirus 2 by RT PCR: NEGATIVE

## 2021-07-13 LAB — CBC
HCT: 37.1 % (ref 36.0–46.0)
Hemoglobin: 11.3 g/dL — ABNORMAL LOW (ref 12.0–15.0)
MCH: 24.9 pg — ABNORMAL LOW (ref 26.0–34.0)
MCHC: 30.5 g/dL (ref 30.0–36.0)
MCV: 81.9 fL (ref 80.0–100.0)
Platelets: UNDETERMINED 10*3/uL (ref 150–400)
RBC: 4.53 MIL/uL (ref 3.87–5.11)
RDW: 15.5 % (ref 11.5–15.5)
WBC: 7.5 10*3/uL (ref 4.0–10.5)
nRBC: 0 % (ref 0.0–0.2)

## 2021-07-13 LAB — PROTIME-INR
INR: 0.9 (ref 0.8–1.2)
Prothrombin Time: 12.4 seconds (ref 11.4–15.2)

## 2021-07-13 LAB — I-STAT BETA HCG BLOOD, ED (MC, WL, AP ONLY): I-stat hCG, quantitative: <5 m[IU]/mL (ref <5)

## 2021-07-13 LAB — SAMPLE TO BLOOD BANK

## 2021-07-13 LAB — ETHANOL: Alcohol, Ethyl (B): <10 mg/dL (ref <10)

## 2021-07-13 LAB — LACTIC ACID, PLASMA: Lactic Acid, Venous: 2.3 mmol/L (ref 0.5–1.9)

## 2021-07-13 MED ORDER — FENTANYL CITRATE PF 50 MCG/ML IJ SOSY
50.0000 ug | PREFILLED_SYRINGE | Freq: Once | INTRAMUSCULAR | Status: AC
  Administered 2021-07-13: 50 ug via INTRAVENOUS
  Filled 2021-07-13: qty 1

## 2021-07-13 MED ORDER — IOHEXOL 300 MG/ML  SOLN
100.0000 mL | Freq: Once | INTRAMUSCULAR | Status: AC | PRN
Start: 2021-07-13 — End: 2021-07-13
  Administered 2021-07-13: 100 mL via INTRAVENOUS

## 2021-07-13 MED ORDER — LIDOCAINE-EPINEPHRINE (PF) 2 %-1:200000 IJ SOLN
10.0000 mL | Freq: Once | INTRAMUSCULAR | Status: AC
  Administered 2021-07-13: 10 mL
  Filled 2021-07-13: qty 20

## 2021-07-13 MED ORDER — OXYCODONE-ACETAMINOPHEN 5-325 MG PO TABS: 1.0000 | ORAL_TABLET | Freq: Four times a day (QID) | ORAL | 0 refills | Status: DC | PRN

## 2021-07-13 MED ORDER — OXYCODONE-ACETAMINOPHEN 5-325 MG PO TABS
1.0000 | ORAL_TABLET | Freq: Once | ORAL | Status: AC
  Administered 2021-07-13: 1 via ORAL
  Filled 2021-07-13: qty 1

## 2021-07-13 MED ORDER — ONDANSETRON HCL 4 MG/2ML IJ SOLN
4.0000 mg | Freq: Once | INTRAMUSCULAR | Status: AC
  Administered 2021-07-13: 4 mg via INTRAVENOUS
  Filled 2021-07-13: qty 2

## 2021-07-13 MED ORDER — CYCLOBENZAPRINE HCL 10 MG PO TABS: 10.0000 mg | ORAL_TABLET | Freq: Two times a day (BID) | ORAL | 0 refills | Status: DC | PRN

## 2021-07-13 MED ORDER — KETOROLAC TROMETHAMINE 15 MG/ML IJ SOLN
15.0000 mg | Freq: Once | INTRAMUSCULAR | Status: AC
  Administered 2021-07-13: 15 mg via INTRAVENOUS
  Filled 2021-07-13: qty 1
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
## 2021-07-13 NOTE — Progress Notes (Signed)
Orthopedic Tech Progress Note Patient Details:  Pamela Meyers May 02, 1985 [id number]  Patient ID: Pamela Meyers, female   DOB: 1985/04/03, 36 y.o.   MRN: [id number] Level II' not needed at the moment.  Pamela Meyers 07/13/2021, 10:20 PM
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
## 2021-07-13 NOTE — ED Notes (Signed)
Patient currently at CT scan .  
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
## 2023-11-01 ENCOUNTER — Ambulatory Visit: Admitting: Nurse Practitioner

## 2023-11-01 NOTE — Progress Notes (Deleted)
 There were no vitals taken for this visit.   Subjective:    Patient ID: Pamela Meyers, female    DOB: 11/10/1985, 38 y.o.   MRN: [id number]  HPI: Pamela Meyers is a 38 y.o. female  No chief complaint on file.  Patient presents to clinic to establish care with new PCP.  Introduced to Publishing rights manager role and practice setting.  All questions answered.  Discussed provider/patient relationship and expectations.  Patient reports a history of ***. Patient denies a history of: Hypertension, Elevated Cholesterol, Diabetes, Thyroid problems, Depression, Anxiety, Neurological problems, and Abdominal problems.   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   Head trauma 07/27/2015   Dysmenorrhea 08/12/2015   Wheezing on auscultation 08/23/2015   Productive cough 08/23/2015   Infected skin lesion 09/23/2015   Superficial bacterial skin infection 10/19/2015   Oral herpes simplex infection 10/21/2015   Resolved Ambulatory Problems    Diagnosis Date Noted   Otitis externa of right ear 08/23/2015   Acute sinusitis 08/24/2015   Past Medical History:  Diagnosis Date   Anxiety    Attention deficit disorder (ADD) without hyperactivity    Past Surgical History:  Procedure Laterality Date   APPENDECTOMY     CHOLECYSTECTOMY     TUBAL LIGATION     Family History  Problem Relation Age of Onset   Non-Hodgkin's lymphoma Father    ADD / ADHD Sister    Heart murmur Maternal Aunt        bypass   Anxiety disorder Maternal Aunt    Atrial fibrillation Maternal Grandmother    Diabetes Maternal Grandmother    Heart disease Maternal Grandmother    Parkinson's disease Maternal Grandfather      Review of Systems  Per HPI unless specifically indicated above     Objective:    There were no vitals taken for this visit.  Wt Readings from Last 3  Encounters:  08/19/20 155 lb (70.3 kg)  04/09/20 160 lb (72.6 kg)  06/02/19 145 lb (65.8 kg)    Physical Exam  Results for orders placed or performed during the hospital encounter of 07/13/21  Comprehensive metabolic panel   Collection Time: 07/13/21  9:59 PM  Result Value Ref Range   Sodium 139 135 - 145 mmol/L   Potassium 3.8 3.5 - 5.1 mmol/L   Chloride 105 98 - 111 mmol/L   CO2 25 22 - 32 mmol/L   Glucose, Bld 89 70 - 99 mg/dL   BUN 8 6 - 20 mg/dL   Creatinine, Ser 8.95 (H) 0.44 - 1.00 mg/dL   Calcium 9.3 8.9 - 89.6 mg/dL   Total Protein 6.5 6.5 - 8.1 g/dL   Albumin 3.7 3.5 - 5.0 g/dL   AST 28 15 - 41 U/L   ALT 24 0 - 44 U/L   Alkaline Phosphatase 69 38 - 126 U/L   Total Bilirubin 0.2 (L) 0.3 - 1.2 mg/dL   GFR, Estimated >39 >39 mL/min   Anion gap 9 5 - 15  CBC   Collection Time: 07/13/21  9:59 PM  Result Value Ref Range   WBC 7.5 4.0 - 10.5 K/uL   RBC 4.53 3.87 - 5.11 MIL/uL   Hemoglobin 11.3 (L) 12.0 - 15.0 g/dL   HCT 62.8 63.9 - 53.9 %   MCV 81.9 80.0 - 100.0 fL   MCH 24.9 (  L) 26.0 - 34.0 pg   MCHC 30.5 30.0 - 36.0 g/dL   RDW 84.4 88.4 - 84.4 %   Platelets PLATELET CLUMPS NOTED ON SMEAR, UNABLE TO ESTIMATE 150 - 400 K/uL   nRBC 0 0.0 - 0.2 %  Ethanol   Collection Time: 07/13/21  9:59 PM  Result Value Ref Range   Alcohol, Ethyl (B) <10 <10 mg/dL  Protime-INR   Collection Time: 07/13/21  9:59 PM  Result Value Ref Range   Prothrombin Time 12.4 11.4 - 15.2 seconds   INR 0.9 0.8 - 1.2  Sample to Blood Bank   Collection Time: 07/13/21  9:59 PM  Result Value Ref Range   Blood Bank Specimen SAMPLE AVAILABLE FOR TESTING    Sample Expiration      07/14/2021,2359 Performed at [facility], [address], [place], [place] [address]   Resp Panel by RT-PCR (Flu A&B, Covid) Anterior Nasal Swab   Collection Time: 07/13/21 10:01 PM   Specimen: Anterior Nasal Swab  Result Value Ref Range   SARS Coronavirus 2 by RT PCR NEGATIVE NEGATIVE   Influenza A by PCR  NEGATIVE NEGATIVE   Influenza B by PCR NEGATIVE NEGATIVE  Lactic acid, plasma   Collection Time: 07/13/21 10:05 PM  Result Value Ref Range   Lactic Acid, Venous 2.3 (HH) 0.5 - 1.9 mmol/L  I-Stat Beta hCG blood, ED ([facility], [facility], [facility] only)   Collection Time: 07/13/21 10:11 PM  Result Value Ref Range   I-stat hCG, quantitative <5.0 <5 mIU/mL   Comment 3          I-Stat Chem 8, ED   Collection Time: 07/13/21 10:13 PM  Result Value Ref Range   Sodium 138 135 - 145 mmol/L   Potassium 3.7 3.5 - 5.1 mmol/L   Chloride 103 98 - 111 mmol/L   BUN 7 6 - 20 mg/dL   Creatinine, Ser 9.09 0.44 - 1.00 mg/dL   Glucose, Bld 82 70 - 99 mg/dL   Calcium, Ion 8.95 (L) 1.15 - 1.40 mmol/L   TCO2 24 22 - 32 mmol/L   Hemoglobin 12.9 12.0 - 15.0 g/dL   HCT 61.9 63.9 - 53.9 %      Assessment & Plan:   Problem List Items Addressed This Visit   None    Follow up plan: No follow-ups on file.

## 2023-12-06 ENCOUNTER — Ambulatory Visit: Payer: Self-pay

## 2023-12-06 NOTE — Telephone Encounter (Signed)
 FYI Only or Action Required?: FYI only for provider.  Patient was last seen in primary care on n/a.  Called Nurse Triage reporting Shortness of Breath. palpitations  Symptoms began several months ago.  Interventions attempted: Nothing.  Symptoms are: gradually worsening.  Triage Disposition: See HCP Within 4 Hours (Or PCP Triage)  Patient/caregiver understands and will follow disposition?: Yes  RN gave instructions on when to go to [facility]. Pt stated understanding.   Copied from CRM #[id number]. 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 She states years ago her PCP told her she needed to see a heart doctor but she was too scared. Now she is having increasing symptoms so is getting set up. She has a family history of afib and htn. She states she gets short of breathing walking up the stairs to her apartment, any sexual activy she has to catch her breath after.     1. DESCRIPTION: Please describe your heart rate or heartbeat that you are having (e.g., fast/slow, regular/irregular, skipped or extra beats, palpitations)     Palpitations, jittery feeling 2. ONSET: When did it start? (e.g., minutes, hours, days)      A couple of months ago  3. DURATION: How long does it last (e.g., seconds, minutes, hours)      4. PATTERN Does it come and go, or has it been constant since it started?  Does it get worse with exertion?   Are you feeling it now?     Yes, intermittent, can tell after sexual exertion as well, needs to rest for a while to catch her breah  7. RECURRENT SYMPTOM: Have you ever had this before? If Yes, ask: When was the last  time? and What happened that time?      Yes but its happening more frequently 8. CAUSE: What do you think is causing the palpitations?     unknown 9. CARDIAC HISTORY: Do you have any history of heart disease? (e.g., heart attack, angina, bypass surgery, angioplasty, arrhythmia)      No- but was told to see a cardiologist  10. OTHER SYMPTOMS: Do you have any other symptoms? (e.g., dizziness, chest pain, sweating, difficulty breathing)       Headache, dizziness occassionally  Answer Assessment - Initial Assessment Questions Climbing stairs out of breath Wakes up with headaches daily Heart flluttering- jittery    1. RESPIRATORY STATUS: Describe your breathing? (e.g., wheezing, shortness of breath, unable to speak, severe coughing)      *No Answer* 2. ONSET: When did this breathing problem begin?      *No Answer* 3. PATTERN Does the difficult breathing come and go, or has it been constant since it started?      *No Answer* 4. SEVERITY: How bad is your breathing? (e.g., mild, moderate, severe)      *No Answer* 5. RECURRENT SYMPTOM: Have you had difficulty breathing before? If Yes, ask: When was the last time? and What happened that time?      *No Answer* 6. CARDIAC HISTORY: Do you have any history of heart disease? (e.g., heart attack, angina, bypass surgery, angioplasty)      *No Answer*  7. LUNG HISTORY: Do you have any history of lung disease?  (e.g., pulmonary embolus, asthma, emphysema)     *No Answer* 8. CAUSE: What do you think is causing the breathing problem?      *No Answer* 9. OTHER SYMPTOMS: Do you have any other symptoms? (e.g., chest pain, cough, dizziness, fever, runny nose)     *No Answer* 10. O2 SATURATION MONITOR:  Do you use an oxygen saturation monitor (pulse oximeter) at home? If Yes, ask: What is your reading (oxygen level) today? What is your usual oxygen saturation reading? (e.g., 95%)       *No Answer* 11. PREGNANCY: Is  there any chance you are pregnant? When was your last menstrual period?       *No Answer* 12. TRAVEL: Have you traveled out of the country in the last month? (e.g., travel history, exposures)       *No Answer*  Protocols used: Breathing Difficulty-A-AH, Heart Rate and Heartbeat Questions-A-AH

## 2023-12-07 ENCOUNTER — Encounter: Payer: Self-pay | Admitting: Family Medicine

## 2023-12-07 ENCOUNTER — Ambulatory Visit (INDEPENDENT_AMBULATORY_CARE_PROVIDER_SITE_OTHER): Admitting: Family Medicine

## 2023-12-07 VITALS — BP 147/101 | HR 80 | Resp 16 | Ht 61.0 in | Wt 187.0 lb

## 2023-12-07 DIAGNOSIS — F411 Generalized anxiety disorder: Secondary | ICD-10-CM

## 2023-12-07 DIAGNOSIS — R002 Palpitations: Secondary | ICD-10-CM

## 2023-12-07 DIAGNOSIS — Z7689 Persons encountering health services in other specified circumstances: Secondary | ICD-10-CM | POA: Diagnosis not present

## 2023-12-07 DIAGNOSIS — F17210 Nicotine dependence, cigarettes, uncomplicated: Secondary | ICD-10-CM

## 2023-12-07 MED ORDER — CLONIDINE HCL 0.1 MG PO TABS
0.1000 mg | ORAL_TABLET | Freq: Once | ORAL | Status: AC
  Administered 2023-12-07: 0.1 mg via ORAL

## 2023-12-07 NOTE — Progress Notes (Signed)
 New Patient Office Visit  Subjective   Patient ID: Pamela Meyers, female    DOB: [birth year]  Age: 38 y.o. 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 Palpitations and heart murmer dx in past but before she could see cardiology she lost medicaid.    Discussed the use of AI scribe software for clinical note transcription with the patient, who gave verbal consent to proceed.  History of Present Illness   Pamela Meyers is a 38 year old female who presents to establish with [facility] at [facility]. She has a history of anxiety and ADD who presents with heart palpitations and shortness of breath. Her previous PCP was Channing Schaffer, FNP-C, who she saw a long time ago.   She experiences heart palpitations and shortness of breath, describing her heart racing and a pulsing sensation in her eyes. These symptoms occur sporadically, including after waking up and during physical exertion, such as walking up stairs or after sexual activity. She becomes short of breath and needs to rest to calm down. She also experiences a sensation of her heart skipping a beat and sometimes needs to catch her breath suddenly.  She describes an episode where she woke up, went outside, and experienced throbbing pain in her arm with numbness in her hand. 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  Her past medical history includes anxiety, ADD, and a history of substance use, specifically heroin and amphetamines, from which she has been clean for a year. She smokes cigarettes and wants to quit. She has gained weight since quitting drugs, attributing it to increased appetite and enjoyment of food.  Family history is significant for atrial fibrillation on her mother's side, with her grandmother and aunt having had heart issues,  including her aunt requiring open heart surgery in her late twenties to early thirties due to a heart murmur.  No significant swelling in her lower legs currently, although she experienced it in the past during substance use. She has not been previously diagnosed with high blood pressure but was told she might have an irregular heartbeat and a possible heart murmur.        12/07/2023    8:38 AM  GAD 7 : Generalized Anxiety Score  Nervous, Anxious, on Edge 1  Control/stop worrying 1  Worry too much - different things 1  Trouble relaxing 1  Restless 0  Easily annoyed or irritable 1  Afraid - awful might happen 1  Total GAD 7 Score 6  Anxiety Difficulty Somewhat difficult      12/07/2023    8:38 AM 03/23/2017   11:20 AM 01/15/2017    8:44 AM  PHQ9 SCORE ONLY  PHQ-9 Total Score 8 1  13       Data saved with a previous flowsheet row definition   Outpatient Encounter Medications as of 12/07/2023  Medication Sig   [DISCONTINUED] acetaminophen  (TYLENOL ) 500 MG tablet Take 500 mg by mouth every 6 (six) hours as needed for mild pain, moderate pain or headache. Reported on 06/23/2015   [DISCONTINUED] amphetamine -dextroamphetamine  (ADDERALL) 10 MG tablet Take 1 tablet (10 mg total) by mouth daily at 12 noon.   [DISCONTINUED] amphetamine -dextroamphetamine  (ADDERALL) 20 MG tablet Take 1 tablet (20 mg total) by mouth every morning.   [DISCONTINUED] clonazePAM  (KLONOPIN )  1 MG tablet Take 1 tablet (1 mg total) by mouth 2 (two) times daily as needed for anxiety.   [DISCONTINUED] cyclobenzaprine  (FLEXERIL ) 10 MG tablet Take 1 tablet (10 mg total) by mouth 2 (two) times daily as needed for muscle spasms.   [DISCONTINUED] HYDROcodone -acetaminophen  (NORCO/VICODIN) 5-325 MG tablet Take 1 tablet by mouth every 6 (six) hours as needed for moderate pain.   [DISCONTINUED] norgestimate -ethinyl estradiol  (ORTHO-CYCLEN,SPRINTEC,PREVIFEM) 0.25-35 MG-MCG tablet Take 1 tablet by mouth daily. (Patient not taking:  Reported on 12/07/2023)   [DISCONTINUED] oxyCODONE -acetaminophen  (PERCOCET/ROXICET) 5-325 MG tablet Take 1 tablet by mouth every 6 (six) hours as needed for severe pain.   [DISCONTINUED] phenazopyridine  (PYRIDIUM ) 200 MG tablet Take 1 tablet (200 mg total) by mouth 3 (three) times daily as needed for pain.   [DISCONTINUED] predniSONE  (DELTASONE ) 20 MG tablet Take 2 tablets (40 mg total) by mouth daily with breakfast.   [DISCONTINUED] sulfamethoxazole -trimethoprim  (BACTRIM  DS) 800-160 MG tablet Take 1 tablet by mouth 2 (two) times daily.   [DISCONTINUED] valACYclovir  (VALTREX ) 1000 MG tablet Take 2 tablets (2,000 mg total) by mouth 2 (two) times daily.   [EXPIRED] cloNIDine (CATAPRES) tablet 0.1 mg    No facility-administered encounter medications on file as of 12/07/2023.   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   Past Medical History:  Diagnosis Date   Anxiety    Attention deficit disorder (ADD) without hyperactivity    Past Surgical History:  Procedure Laterality Date   APPENDECTOMY     CHOLECYSTECTOMY     TUBAL LIGATION     Family History  Problem Relation Age of Onset   Non-Hodgkin's lymphoma Father    ADD / ADHD Sister    Graves' disease Sister    Atrial fibrillation Maternal Grandmother    Diabetes Maternal Grandmother    Heart disease Maternal Grandmother        open heart late 20s-30s   Parkinson's disease Maternal Grandfather    Heart murmur Maternal Aunt        bypass   Anxiety disorder Maternal Aunt    Social History   Socioeconomic History   Marital status: Married     Spouse name: Not on file   Number of children: Not on file   Years of education: Not on file   Highest education level: Not on file  Occupational History   Not on file  Tobacco Use   Smoking status: Every Day    Current packs/day: 0.50    Average packs/day: 0.5 packs/day for 17.0 years (8.5 ttl pk-yrs)    Types: Cigarettes   Smokeless tobacco: Never  Vaping Use   Vaping status: Never Used  Substance and Sexual Activity   Alcohol use: No   Drug use: No    Comment: 1 year clean from heroin & amphetamines   Sexual activity: Yes    Birth control/protection: Surgical    Comment: last sex two days ago  Other Topics Concern   Not on file  Social History Narrative   Not on file   Social Drivers of Health   Financial Resource Strain: Not on file  Food Insecurity: Not on file  Transportation Needs: Not on file  Physical Activity: Not on file  Stress: Not on file  Social Connections: Not on file  Intimate Partner Violence: Not on file   Outpatient Medications Prior to Visit  Medication Sig Dispense Refill   acetaminophen  (TYLENOL ) 500 MG tablet Take 500 mg by mouth every 6 (six) hours as needed for mild pain, moderate pain or headache. Reported on 06/23/2015     amphetamine -dextroamphetamine  (ADDERALL) 10 MG tablet Take 1 tablet (10 mg total) by mouth daily at 12 noon. 30 tablet 0   amphetamine -dextroamphetamine  (ADDERALL) 20 MG tablet Take 1 tablet (20 mg total) by mouth every morning. 30 tablet 0   clonazePAM  (KLONOPIN ) 1 MG tablet Take 1 tablet (1 mg total) by mouth 2 (two) times daily as needed for anxiety. 60 tablet 0   cyclobenzaprine  (FLEXERIL ) 10 MG tablet Take 1 tablet (10 mg total) by mouth 2 (two) times daily as needed for muscle spasms. 20 tablet 0   HYDROcodone -acetaminophen  (NORCO/VICODIN) 5-325 MG tablet Take 1 tablet by mouth every 6 (six) hours as needed for moderate pain. 30 tablet 0   norgestimate -ethinyl estradiol  (ORTHO-CYCLEN,SPRINTEC,PREVIFEM) 0.25-35 MG-MCG  tablet Take 1 tablet by mouth daily. (Patient not taking: Reported on 12/07/2023) 1 Package 11   oxyCODONE -acetaminophen  (PERCOCET/ROXICET) 5-325 MG tablet Take 1 tablet by mouth every 6 (six) hours as needed for severe pain. 10 tablet 0   phenazopyridine  (PYRIDIUM ) 200 MG tablet Take 1 tablet (200 mg total) by mouth 3 (three) times daily as needed for pain. 6 tablet 0   predniSONE  (DELTASONE ) 20 MG tablet Take 2 tablets (40 mg total) by mouth daily with breakfast. 10 tablet 0   sulfamethoxazole -trimethoprim  (BACTRIM  DS) 800-160 MG tablet Take 1 tablet by mouth 2 (two) times daily. 20 tablet 0   valACYclovir  (VALTREX ) 1000 MG tablet Take 2 tablets (2,000 mg total) by mouth 2 (two) times daily. 4 tablet 1   No facility-administered medications prior to visit.   Allergies  Allergen Reactions   Morphine And Codeine Hives   Penicillins Hives   Ultram [Tramadol] Nausea And Vomiting   Codeine Palpitations   ROS: see HPI    Objective   Today's Vitals   12/07/23 0838 12/07/23 0935 12/07/23 0955  BP: (!) 161/104 (!) 158/98 (!) 147/101  Pulse: 80    Resp: 16    SpO2: 97%    Weight: 187 lb (84.8 kg)    Height: 5' 1 (1.549 m)    PainSc: 0-No pain     Physical Exam Vitals reviewed.  Constitutional:      Appearance: Normal appearance.  Neck:     Vascular: No carotid bruit.  Cardiovascular:     Rate and Rhythm: Normal rate and regular rhythm.     Pulses: Normal pulses.     Heart sounds: Normal heart sounds, S1 normal and S2 normal. No murmur heard. Pulmonary:     Effort: Pulmonary effort is normal.     Breath sounds: Normal breath sounds and air entry.  Musculoskeletal:     Right lower leg: No edema.     Left lower leg: No edema.  Neurological:     Mental Status: She is alert.  Psychiatric:        Mood and Affect: Mood normal.        Behavior: Behavior normal.    Assessment & Plan:   1. Encounter to establish care (Primary) Patient is a 79- year-old female who presents today  to establish care with primary care at  [facility]. Reviewed the past medical history, family history, social history, surgical history, medications and allergies today- updates made as indicated. Patient has concerns today about heart palpitations and shortness of breath.    2. Palpitations Patient presents today with elevated blood pressure, repeat blood pressure elevated. Patient in no acute distress and is well-appearing. Denies chest pain, shortness of breath at rest, lower extremity edema, vision changes. She does report current headache. Cardiovascular exam with heart regular rate and rhythm. Normal heart sounds, no murmurs present. No lower extremity edema present. Lungs clear to auscultation bilaterally. Clonidine 0.1mg  given in office today. Repeat blood pressure still elevated. Differential includes dysrhythmia, thyroid disorder, anemia, or electrolyte imbalance. EKG showing sinus bradycardia at 59 bpm. Will obtain baseline labs: thyroid function, CBC, electrolytes, and kidney function. Consider heart monitor if symptoms persist. Advised patient to closely monitor blood pressure at home. Return to office sooner if blood pressure begins to increase greater than 130/80. Follow-up in 2 weeks.    - EKG 12-Lead - CBC with Differential/Platelet - Comprehensive metabolic panel with GFR - Hemoglobin A1c - Lipid panel - TSH Rfx on Abnormal to Free T4  3. Generalized anxiety disorder Anxiety may contribute to palpitations and elevated blood pressure. Would like to rule out baseline labs before starting medication. Declines therapy at this time.   4. Cigarette nicotine dependence without complication Desires to quit smoking, acknowledges health impact. She will consider.    Return in about 2 weeks (around 12/21/2023) for HTN follow-up.   Evalene Arts, FNP

## 2023-12-07 NOTE — Patient Instructions (Signed)

## 2023-12-08 LAB — LIPID PANEL
Chol/HDL Ratio: 5.4 ratio — ABNORMAL HIGH (ref 0.0–4.4)
Cholesterol, Total: 212 mg/dL — ABNORMAL HIGH (ref 100–199)
HDL: 39 mg/dL — ABNORMAL LOW (ref >39)
LDL Chol Calc (NIH): 134 mg/dL — ABNORMAL HIGH (ref 0–99)
Triglycerides: 218 mg/dL — ABNORMAL HIGH (ref 0–149)
VLDL Cholesterol Cal: 39 mg/dL (ref 5–40)

## 2023-12-08 LAB — HEMOGLOBIN A1C
Est. average glucose Bld gHb Est-mCnc: 120 mg/dL
Hgb A1c MFr Bld: 5.8 % — ABNORMAL HIGH (ref 4.8–5.6)

## 2023-12-08 LAB — CBC WITH DIFFERENTIAL/PLATELET
Basophils Absolute: 0.1 x10E3/uL (ref 0.0–0.2)
Basos: 1 %
EOS (ABSOLUTE): 0.3 x10E3/uL (ref 0.0–0.4)
Eos: 4 %
Hematocrit: 42.9 % (ref 34.0–46.6)
Hemoglobin: 13.1 g/dL (ref 11.1–15.9)
Immature Grans (Abs): 0 x10E3/uL (ref 0.0–0.1)
Immature Granulocytes: 0 %
Lymphocytes Absolute: 2.2 x10E3/uL (ref 0.7–3.1)
Lymphs: 26 %
MCH: 25.1 pg — ABNORMAL LOW (ref 26.6–33.0)
MCHC: 30.5 g/dL — ABNORMAL LOW (ref 31.5–35.7)
MCV: 82 fL (ref 79–97)
Monocytes Absolute: 0.7 x10E3/uL (ref 0.1–0.9)
Monocytes: 8 %
Neutrophils Absolute: 5.3 x10E3/uL (ref 1.4–7.0)
Neutrophils: 61 %
Platelets: 413 x10E3/uL (ref 150–450)
RBC: 5.21 x10E6/uL (ref 3.77–5.28)
RDW: 14.9 % (ref 11.7–15.4)
WBC: 8.6 x10E3/uL (ref 3.4–10.8)

## 2023-12-08 LAB — COMPREHENSIVE METABOLIC PANEL WITH GFR
ALT: 22 IU/L (ref 0–32)
AST: 20 IU/L (ref 0–40)
Albumin: 4.4 g/dL (ref 3.9–4.9)
Alkaline Phosphatase: 96 IU/L (ref 41–116)
BUN/Creatinine Ratio: 17 (ref 9–23)
BUN: 13 mg/dL (ref 6–24)
Bilirubin Total: 0.3 mg/dL (ref 0.0–1.2)
CO2: 22 mmol/L (ref 20–29)
Calcium: 9.7 mg/dL (ref 8.7–10.2)
Chloride: 103 mmol/L (ref 96–106)
Creatinine, Ser: 0.75 mg/dL (ref 0.57–1.00)
Globulin, Total: 2.7 g/dL (ref 1.5–4.5)
Glucose: 80 mg/dL (ref 70–99)
Potassium: 4.6 mmol/L (ref 3.5–5.2)
Sodium: 139 mmol/L (ref 134–144)
Total Protein: 7.1 g/dL (ref 6.0–8.5)
eGFR: 103 mL/min/1.73 (ref >59)

## 2023-12-08 LAB — TSH RFX ON ABNORMAL TO FREE T4: TSH: 2.1 u[IU]/mL (ref 0.450–4.500)

## 2023-12-10 ENCOUNTER — Ambulatory Visit: Attending: Family Medicine

## 2023-12-10 ENCOUNTER — Ambulatory Visit: Payer: Self-pay | Admitting: Family Medicine

## 2023-12-10 DIAGNOSIS — R002 Palpitations: Secondary | ICD-10-CM

## 2023-12-17 ENCOUNTER — Other Ambulatory Visit: Payer: Self-pay | Admitting: Medical Genetics

## 2023-12-18 ENCOUNTER — Other Ambulatory Visit

## 2023-12-21 ENCOUNTER — Ambulatory Visit (INDEPENDENT_AMBULATORY_CARE_PROVIDER_SITE_OTHER): Admitting: Family Medicine

## 2023-12-21 ENCOUNTER — Encounter: Payer: Self-pay | Admitting: Family Medicine

## 2023-12-21 VITALS — BP 124/85 | HR 92 | Resp 16 | Ht 61.0 in | Wt 183.4 lb

## 2023-12-21 DIAGNOSIS — H5369 Other night blindness: Secondary | ICD-10-CM | POA: Diagnosis not present

## 2023-12-21 DIAGNOSIS — R030 Elevated blood-pressure reading, without diagnosis of hypertension: Secondary | ICD-10-CM | POA: Diagnosis not present

## 2023-12-21 DIAGNOSIS — E782 Mixed hyperlipidemia: Secondary | ICD-10-CM | POA: Diagnosis not present

## 2023-12-21 NOTE — Progress Notes (Signed)
 Established Patient Office Visit  Subjective  Patient ID: Pamela Meyers, female    DOB: [birth year]  Age: 38 y.o. MRN: [id number]  Chief Complaint  Patient presents with   Hypertension   Discussed the use of AI scribe software for clinical note transcription with the patient, who gave verbal consent to proceed.  History of Present Illness    Pamela Meyers is a 38 year old female with hypertension who presents for a blood pressure follow-up.  She is managing her hypertension and has recently acquired a blood pressure cuff, though she has not been checking her blood pressure regularly at home. She has made dietary changes, such as replacing a BLT with a protein shake for breakfast. No chest pain, dizziness, or headaches. She feels more at ease now compared to when she first sought medical attention.  She is currently wearing a heart monitor, which she will remove on the 12th of this month. She has experienced a couple of episodes while wearing the monitor, but nothing drastic. There is a family history of heart disease and high cholesterol on her mother's side.  Regarding her cholesterol, she drinks one cup of coffee in the mornings and has reduced her intake since her health concerns began. No swelling in her lower legs.  She recently regained her driver's license but struggled with the vision test due to astigmatism. She has had glasses in the past and is seeking a referral to an eye doctor who accepts her Medicaid, as she experiences difficulty with night vision and reading small print.  She is considering joining a gym to increase her physical activity, influenced by her boss who had a heart attack and now advocates for gym attendance.     HYPERTENSION: Pamela Meyers presents for follow-up for elevated blood pressure readings.   Patient's current hypertension medication regimen is: no medications  Patient is occasionally keeping a check on BP at home.  Adhering to low sodium diet:  yes  Exercising Regularly: planning on joining a gym  Denies headache, dizziness, CP, SHOB, vision changes.    BP Readings from Last 3 Encounters:  12/21/23 124/85  12/07/23 (!) 147/101  07/13/21 125/75   The 10-year ASCVD risk score (Arnett DK, et al., 2019) is: 4.9%   Values used to calculate the score:     Age: 59 years     Clincally relevant sex: Female     Is Non-Hispanic African American: No     Diabetic: No     Tobacco smoker: Yes     Systolic Blood Pressure: 124 mmHg     Is BP treated: No     HDL Cholesterol: 39 mg/dL     Total Cholesterol: 212 mg/dL  ROS: see HPI     Objective:    BP 124/85   Pulse 92   Resp 16   Ht 5' 1 (1.549 m)   Wt 183 lb 6.4 oz (83.2 kg)   SpO2 95%   BMI 34.65 kg/m  BP Readings from Last 3 Encounters:  12/21/23 124/85  12/07/23 (!) 147/101  07/13/21 125/75    Physical Exam Vitals reviewed.  Constitutional:      Appearance: Normal appearance.  Cardiovascular:     Rate and Rhythm: Normal rate and regular rhythm.     Pulses: Normal pulses.     Heart sounds: Normal heart sounds.  Pulmonary:     Effort: Pulmonary effort is normal.     Breath sounds: Normal breath sounds.  Musculoskeletal:  Right lower leg: No edema.     Left lower leg: No edema.  Neurological:     Mental Status: She is alert.  Psychiatric:        Mood and Affect: Mood normal.        Behavior: Behavior normal.     Assessment & Plan:   1. Blood pressure elevated without history of HTN (Primary) Patient presents today with well-controlled blood pressure. Patient in no acute distress and is well-appearing. Denies chest pain, shortness of breath, lower extremity edema, vision changes, headaches. Cardiovascular exam with heart regular rate and rhythm. Normal heart sounds, no murmurs present. No lower extremity edema present. Lungs clear to auscultation bilaterally. Discussed salt and coffee impact on blood pressure. Advised patient to closely monitor blood  pressure, and return to office sooner if blood pressure begins to increase greater than 130/80.  - Ambulatory referral to Ophthalmology  2. Diminished night vision Reports difficulty with night vision and blurry vision. Astigmatism noted. Referred to eye doctor for evaluation and potential glasses prescription.  - Ambulatory referral to Ophthalmology  3. Mixed hyperlipidemia Cardiovascular risk 4.9%, below medication threshold. Family history of heart disease noted. Lifestyle modifications discussed, including dietary changes and consider gym membership. Repeat cholesterol levels in 3-6 months.    Return if symptoms worsen or fail to improve.    Evalene Arts, FNP

## 2024-01-04 DIAGNOSIS — R002 Palpitations: Secondary | ICD-10-CM | POA: Diagnosis not present

## 2024-01-06 DIAGNOSIS — R002 Palpitations: Secondary | ICD-10-CM | POA: Diagnosis not present

## 2024-01-07 ENCOUNTER — Ambulatory Visit: Payer: Self-pay | Admitting: Family Medicine

## 2024-01-17 ENCOUNTER — Encounter: Payer: Self-pay | Admitting: Family Medicine

## 2024-01-21 ENCOUNTER — Other Ambulatory Visit: Payer: Self-pay | Admitting: Family Medicine

## 2024-01-21 DIAGNOSIS — F17210 Nicotine dependence, cigarettes, uncomplicated: Secondary | ICD-10-CM

## 2024-01-21 MED ORDER — VARENICLINE TARTRATE (STARTER) 0.5 MG X 11 & 1 MG X 42 PO TBPK: ORAL_TABLET | ORAL | 0 refills | Status: AC
